# Patient Record
Sex: Male | Born: 1935 | Marital: Single | Smoking: Former smoker
Health system: Southern US, Community
[De-identification: ages and names within clinical notes are randomized; demographics above are authoritative.]

## PROBLEM LIST (undated history)

## (undated) DIAGNOSIS — I499 Cardiac arrhythmia, unspecified: Secondary | ICD-10-CM

## (undated) DIAGNOSIS — E039 Hypothyroidism, unspecified: Secondary | ICD-10-CM

## (undated) DIAGNOSIS — I10 Essential (primary) hypertension: Secondary | ICD-10-CM

## (undated) DIAGNOSIS — R2681 Unsteadiness on feet: Secondary | ICD-10-CM

## (undated) DIAGNOSIS — N19 Unspecified kidney failure: Secondary | ICD-10-CM

## (undated) DIAGNOSIS — E079 Disorder of thyroid, unspecified: Secondary | ICD-10-CM

## (undated) DIAGNOSIS — G629 Polyneuropathy, unspecified: Secondary | ICD-10-CM

## (undated) DIAGNOSIS — E785 Hyperlipidemia, unspecified: Secondary | ICD-10-CM

## (undated) DIAGNOSIS — I517 Cardiomegaly: Secondary | ICD-10-CM

## (undated) DIAGNOSIS — I350 Nonrheumatic aortic (valve) stenosis: Secondary | ICD-10-CM

## (undated) DIAGNOSIS — J449 Chronic obstructive pulmonary disease, unspecified: Secondary | ICD-10-CM

## (undated) HISTORY — DX: Disorder of thyroid, unspecified: E07.9

## (undated) HISTORY — DX: Chronic obstructive pulmonary disease, unspecified: J44.9

## (undated) HISTORY — DX: Unspecified kidney failure: N19

## (undated) HISTORY — DX: Polyneuropathy, unspecified: G62.9

## (undated) HISTORY — DX: Unsteadiness on feet: R26.81

---

## 1973-10-15 HISTORY — PX: HEMORRHOID SURGERY: SHX153

## 2008-03-22 ENCOUNTER — Ambulatory Visit: Payer: Self-pay | Admitting: Internal Medicine

## 2011-12-14 ENCOUNTER — Ambulatory Visit: Payer: Self-pay | Admitting: Internal Medicine

## 2012-02-03 LAB — COMPREHENSIVE METABOLIC PANEL
Anion Gap: 12 (ref 7–16)
BUN: 25 mg/dL — ABNORMAL HIGH (ref 7–18)
EGFR (African American): 42 — ABNORMAL LOW
EGFR (Non-African Amer.): 36 — ABNORMAL LOW
Potassium: 4.9 mmol/L (ref 3.5–5.1)
SGPT (ALT): 12 U/L
Total Protein: 7.1 g/dL (ref 6.4–8.2)

## 2012-02-03 LAB — CBC WITH DIFFERENTIAL/PLATELET
Basophil #: 0 10*3/uL (ref 0.0–0.1)
Eosinophil %: 0.1 %
HGB: 12.2 g/dL — ABNORMAL LOW (ref 13.0–18.0)
Lymphocyte #: 0.6 10*3/uL — ABNORMAL LOW (ref 1.0–3.6)
Lymphocyte %: 6.2 %
Neutrophil %: 88.1 %
Platelet: 144 10*3/uL — ABNORMAL LOW (ref 150–440)
WBC: 10.1 10*3/uL (ref 3.8–10.6)

## 2012-02-03 LAB — URINALYSIS, COMPLETE
Bacteria: NONE SEEN
Bilirubin,UR: NEGATIVE
Leukocyte Esterase: NEGATIVE
Nitrite: NEGATIVE
Ph: 5 (ref 4.5–8.0)
Protein: 30
Squamous Epithelial: <1

## 2012-02-03 LAB — CK TOTAL AND CKMB (NOT AT ARMC)
CK, Total: 424 U/L — ABNORMAL HIGH (ref 35–232)
CK-MB: 1.5 ng/mL (ref 0.5–3.6)

## 2012-02-03 LAB — TROPONIN I: Troponin-I: 0.04 ng/mL

## 2012-02-04 ENCOUNTER — Inpatient Hospital Stay: Payer: Self-pay | Admitting: Internal Medicine

## 2012-02-04 LAB — COMPREHENSIVE METABOLIC PANEL
Alkaline Phosphatase: 62 U/L (ref 50–136)
Anion Gap: 10 (ref 7–16)
BUN: 24 mg/dL — ABNORMAL HIGH (ref 7–18)
Bilirubin,Total: 0.5 mg/dL (ref 0.2–1.0)
Calcium, Total: 8.1 mg/dL — ABNORMAL LOW (ref 8.5–10.1)
Chloride: 110 mmol/L — ABNORMAL HIGH (ref 98–107)
Co2: 19 mmol/L — ABNORMAL LOW (ref 21–32)
EGFR (African American): 47 — ABNORMAL LOW
EGFR (Non-African Amer.): 41 — ABNORMAL LOW
Glucose: 163 mg/dL — ABNORMAL HIGH (ref 65–99)
Osmolality: 285 (ref 275–301)
SGOT(AST): 54 U/L — ABNORMAL HIGH (ref 15–37)
SGPT (ALT): 21 U/L
Sodium: 139 mmol/L (ref 136–145)
Total Protein: 6.4 g/dL (ref 6.4–8.2)

## 2012-02-04 LAB — CBC WITH DIFFERENTIAL/PLATELET
Basophil #: 0 10*3/uL (ref 0.0–0.1)
Eosinophil #: 0.2 10*3/uL (ref 0.0–0.7)
HCT: 35.5 % — ABNORMAL LOW (ref 40.0–52.0)
HGB: 11.7 g/dL — ABNORMAL LOW (ref 13.0–18.0)
Lymphocyte %: 18.2 %
MCH: 29.4 pg (ref 26.0–34.0)
Monocyte %: 12.2 %
Neutrophil #: 3.4 10*3/uL (ref 1.4–6.5)
Platelet: 122 10*3/uL — ABNORMAL LOW (ref 150–440)
RBC: 3.96 10*6/uL — ABNORMAL LOW (ref 4.40–5.90)
RDW: 14.4 % (ref 11.5–14.5)
WBC: 5.2 10*3/uL (ref 3.8–10.6)

## 2012-02-04 LAB — URINE CULTURE

## 2012-02-06 LAB — FOLATE: Folic Acid: 16.3 ng/mL (ref 3.1–100.0)

## 2012-02-06 LAB — TSH: Thyroid Stimulating Horm: 2.09 u[IU]/mL

## 2012-02-08 LAB — CULTURE, BLOOD (SINGLE)

## 2012-03-18 ENCOUNTER — Encounter: Payer: Self-pay | Admitting: Cardiovascular Disease

## 2012-03-18 ENCOUNTER — Ambulatory Visit (INDEPENDENT_AMBULATORY_CARE_PROVIDER_SITE_OTHER): Payer: 59 | Admitting: Cardiovascular Disease

## 2012-03-18 VITALS — BP 116/69 | HR 67 | Ht 70.0 in | Wt 190.0 lb

## 2012-03-18 DIAGNOSIS — R0602 Shortness of breath: Secondary | ICD-10-CM

## 2012-03-18 DIAGNOSIS — G20 Parkinson's disease: Secondary | ICD-10-CM | POA: Insufficient documentation

## 2012-03-18 DIAGNOSIS — E119 Type 2 diabetes mellitus without complications: Secondary | ICD-10-CM | POA: Insufficient documentation

## 2012-03-18 DIAGNOSIS — I498 Other specified cardiac arrhythmias: Secondary | ICD-10-CM | POA: Insufficient documentation

## 2012-03-18 NOTE — Patient Instructions (Signed)
Your physician wants you to follow-up in: 3 months with Dr. Nahser. You will receive a reminder letter in the mail two months in advance. If you don't receive a letter, please call our office to schedule the follow-up appointment. 

## 2012-03-18 NOTE — Assessment & Plan Note (Signed)
Mr. Micheal George presents today for a second opinion regarding his ventricular bigeminy. He was brought in by his daughter Lynden Ang. He was started on amiodarone during recent hospitalization for asymptomatic bigeminy. He has done fairly well. He's not noticed any side effects but his daughter is very concerned about the possible side effects of long-term amiodarone.  I think this bigeminy is fairly benign. He is asymptomatic. He fell because of an upper respiratory tract infection and a temperature of 100.  My advice is  that he discontinue the amiodarone. I'll see him again in 3 months for followup visit. We'll check an EKG at that time. His labs drawn a week ago are unremarkable.

## 2012-03-18 NOTE — Progress Notes (Signed)
Micheal George Date of Birth  November 15, 1924       [facility]    [place] [address], [address]  [address] [place], [place]  [address]   [place], [place]  [address] [phone]     [phone]   Fax  [phone]    Fax [phone]  Problem List: 1. History of ventricular bigeminy-he was started on amiodarone by his medical doctor 2. Hyperlipidemia 3. Diabetes mellitus 4. Hypertension 5. Hypothyroidism 6. Chronic renal insufficiency 7. Carotid artery disease - Left carotid 70-75%, right Carotid 50- 60% 8. Parkinson's disease    History of Present Illness:  Micheal George is an 78 yo seen for a second opinion who is seen today with his daughter Micheal George.  He was recently hospitalized for an episode of generalized weakness that occurred when he had an upper respiratory tract infection. He complained of a cough with yellow sputum. He had a temperature of 100. He was very weak and possibly dehydrated. He fell and was hospitalized.  During his admission he was noted to have bigeminy. He was started on amiodarone because of this bigeminy.      He was also diagnosed with Parkinsons disease and was started on Sinemet. He has not had any specific problems since he was discharged on hospital.  Current Outpatient Prescriptions on File Prior to Visit  Medication Sig Dispense Refill  . aspirin 325 MG tablet Take 325 mg by mouth daily.      . Carbidopa-Levodopa (SINEMET PO) 25/100 mg take one tablet every 6 hours      . gabapentin (NEURONTIN) 100 MG capsule Take 100 mg by mouth 2 (two) times daily.      . insulin glargine (LANTUS SOLOSTAR) 100 UNIT/ML injection Inject 36 Units into the skin every morning.      . insulin glargine (LANTUS SOLOSTAR) 100 UNIT/ML injection Inject 17 Units into the skin at bedtime.      Marland Kitchen levothyroxine (SYNTHROID, LEVOTHROID) 50 MCG tablet Take 50 mcg by mouth daily.      . Loratadine (CLARITIN) 10 MG CAPS Take 10 mg by mouth as  needed.       . metFORMIN (GLUCOPHAGE) 1000 MG tablet Take 1,000 mg by mouth 2 (two) times daily with a meal.      . olmesartan (BENICAR) 20 MG tablet Take 20 mg by mouth daily.      Marland Kitchen omeprazole (PRILOSEC) 20 MG capsule Take 20 mg by mouth daily.        No Known Allergies  Past Medical History  Diagnosis Date  . COPD (chronic obstructive pulmonary disease)   . Diabetes mellitus   . Neuropathy   . Thyroid disease     hypothyroidism  . Renal failure   . Unsteady gait     Past Surgical History  Procedure Date  . Hemorrhoid surgery     History  Smoking status  . Former Smoker  . Types: Cigarettes, Pipe  Smokeless tobacco  . Not on file    History  Alcohol Use: Not on file    Family History  Problem Relation Age of Onset  . Heart disease Father     Reviw of Systems:  Reviewed in the HPI.  All other systems are negative.  Physical Exam: Blood pressure 116/69, pulse 67, height 5\' 10"  (1.778 m), weight 190 lb (86.183 kg). General: Well developed, well nourished, in no acute distress.  Head: Normocephalic, atraumatic, sclera non-icteric, mucus membranes are moist,  Neck: Supple. Carotids are 2 + without bruits. No JVD  Lungs: Clear bilaterally to auscultation.  Heart: regular rate.  normal  S1 S2. No murmurs, gallops or rubs.  Abdomen: Soft, non-tender, non-distended with normal bowel sounds. No hepatomegaly. No rebound/guarding. No masses.  Msk:  Strength and tone are normal  Extremities: No clubbing or cyanosis. No edema.  Distal pedal pulses are 2+ and equal bilaterally.  Neuro: Alert and oriented X 3. Moves all extremities spontaneously.  Psych:  Responds to questions appropriately with a normal affect.  ECG: 03/18/2012 normal sinus rhythm at 69 beats a minute. He has frequent premature ventricular contractions. He has nonspecific ST and T wave changes.  Recent laboratory data from 03/11/2012 reveals hemoglobin of 13. His glucose is 151. Creatinine is  1.56. Potassium is 5.4. Chloride 109. CO2 is 19. Sodium is 140. His liver enzymes were normal. His total cholesterol is 114. Triglyceride levels 294. The HDL is 33. His LDL 22. His hemoglobin A1c is 6.4. TSH is 3.96.  Assessment / Plan:

## 2012-07-02 ENCOUNTER — Ambulatory Visit: Payer: 59 | Admitting: Cardiovascular Disease

## 2012-07-22 ENCOUNTER — Ambulatory Visit (INDEPENDENT_AMBULATORY_CARE_PROVIDER_SITE_OTHER): Payer: 59 | Admitting: Cardiovascular Disease

## 2012-07-22 ENCOUNTER — Encounter: Payer: Self-pay | Admitting: Cardiovascular Disease

## 2012-07-22 VITALS — BP 130/60 | HR 82 | Ht 70.0 in | Wt 188.0 lb

## 2012-07-22 DIAGNOSIS — Z79899 Other long term (current) drug therapy: Secondary | ICD-10-CM

## 2012-07-22 DIAGNOSIS — R0602 Shortness of breath: Secondary | ICD-10-CM

## 2012-07-22 DIAGNOSIS — I498 Other specified cardiac arrhythmias: Secondary | ICD-10-CM

## 2012-07-22 NOTE — Patient Instructions (Addendum)
Follow up with Dr. Elease Hashimoto in 6 months.  Need to have BMET & TSH in two weeks.

## 2012-07-22 NOTE — Progress Notes (Signed)
Micheal George Date of Birth  July 11, 1925       [facility]    [place] [address], [address]  [address] [place], [place]  [address]   [place], [place]  [address] [phone]     [phone]   Fax  [phone]    Fax [phone]  Problem List: 1. History of ventricular bigeminy-he was started on amiodarone by his medical doctor 2. Hyperlipidemia 3. Diabetes mellitus 4. Hypertension 5. Hypothyroidism 6. Chronic renal insufficiency 7. Carotid artery disease - Left carotid 70-75%, right Carotid 50- 60% 8. Parkinson's disease    History of Present Illness:  Micheal George is an 78 yo seen for a second opinion who is seen today with his daughter Micheal George.  He was recently hospitalized for an episode of generalized weakness that occurred when he had an upper respiratory tract infection. He complained of a cough with yellow sputum. He had a temperature of 100. He was very weak and possibly dehydrated. He fell and was hospitalized.  During his admission he was noted to have bigeminy. He was started on amiodarone because of this bigeminy.      He was also diagnosed with Parkinsons disease and was started on Sinemet.  He thinks that he is having more dizziness since the hospitalization.  I saw him 4 months ago and stopped the amiodarone.  He has not had any cardiac  problems since that time.  He is still unsteady on his feet - symptoms likely related to his  Parkinsons or Parkinsons medications.   Current Outpatient Prescriptions on File Prior to Visit  Medication Sig Dispense Refill  . acetaminophen (TYLENOL) 325 MG tablet Take 650 mg by mouth every 6 (six) hours as needed.      Marland Kitchen aspirin 325 MG tablet Take 325 mg by mouth daily.      . Carbidopa-Levodopa (SINEMET PO) 25/100 mg take one tablet every 6 hours      . Fluticasone-Salmeterol (ADVAIR) 100-50 MCG/DOSE AEPB Inhale 1 puff into the lungs daily as needed.      . gabapentin (NEURONTIN) 100 MG  capsule Take 100 mg by mouth 2 (two) times daily.      . GuaiFENesin (MUCUS RELIEF ADULT PO) Take by mouth as needed.      . insulin glargine (LANTUS SOLOSTAR) 100 UNIT/ML injection Inject 36 Units into the skin every morning.      . insulin glargine (LANTUS SOLOSTAR) 100 UNIT/ML injection Inject 17 Units into the skin at bedtime.      . Loratadine (CLARITIN) 10 MG CAPS Take 10 mg by mouth as needed.       . metFORMIN (GLUCOPHAGE) 1000 MG tablet Take 1,000 mg by mouth 2 (two) times daily with a meal.      . olmesartan (BENICAR) 20 MG tablet Take 20 mg by mouth daily.      Marland Kitchen omeprazole (PRILOSEC) 20 MG capsule Take 20 mg by mouth daily.      . simvastatin (ZOCOR) 20 MG tablet Take 20 mg by mouth daily.        No Known Allergies  Past Medical History  Diagnosis Date  . COPD (chronic obstructive pulmonary disease)   . Diabetes mellitus   . Neuropathy   . Thyroid disease     hypothyroidism  . Renal failure   . Unsteady gait     Past Surgical History  Procedure Date  . Hemorrhoid surgery     History  Smoking status  .  Former Smoker  . Types: Cigarettes, Pipe  Smokeless tobacco  . Not on file    History  Alcohol Use No    Family History  Problem Relation Age of Onset  . Heart disease Father     Reviw of Systems:  Reviewed in the HPI.  All other systems are negative.  Physical Exam: Blood pressure 130/60, pulse 82, height 5\' 10"  (1.778 m), weight 188 lb (85.276 kg). General: Well developed, well nourished, in no acute distress.  He has a flat affect. He walks with a shuffled gait.  Head: Normocephalic, atraumatic, sclera non-icteric, mucus membranes are moist,   Neck: Supple. Carotids are 2 + without bruits. No JVD  Lungs: Clear bilaterally to auscultation.  Heart: regular rate.  normal  S1 S2. No murmurs, gallops or rubs.  Abdomen: Soft, non-tender, non-distended with normal bowel sounds. No hepatomegaly. No rebound/guarding. No masses.  Msk:  His strength is  decreased.  Extremities: No clubbing or cyanosis. No edema.  Distal pedal pulses are 2+ and equal bilaterally.  Neuro: Alert and oriented X 3. Moves all extremities spontaneously.  Psych:  Responds to questions appropriately with a normal affect.  ECG: 03/18/2012 normal sinus rhythm at 69 beats a minute. He has frequent premature ventricular contractions. He has nonspecific ST and T wave changes.   laboratory data from 03/11/2012 reveals hemoglobin of 13. His glucose is 151. Creatinine is 1.56. Potassium is 5.4. Chloride 109. CO2 is 19. Sodium is 140. His liver enzymes were normal. His total cholesterol is 114. Triglyceride levels 294. The HDL is 33. His LDL 22. His hemoglobin A1c is 6.4. TSH is 3.96.  Assessment / Plan:

## 2012-07-22 NOTE — Assessment & Plan Note (Signed)
He continues to have problems with ventricular bigeminy. For the most part I think he's asymptomatic.  He's having lots of symptoms due to his carbidopa levodopa therapy and I suspect that this is wise having the dizziness.  He's also having some problems with hypothyroidism. He's had his Synthroid dose held for reasons that are somewhat unclear.  Have recommended that he take all of his medications as prescribed. I'll see him back in 6 months.

## 2012-08-05 ENCOUNTER — Ambulatory Visit (INDEPENDENT_AMBULATORY_CARE_PROVIDER_SITE_OTHER): Payer: 59

## 2012-08-05 DIAGNOSIS — R0602 Shortness of breath: Secondary | ICD-10-CM

## 2012-08-05 DIAGNOSIS — Z79899 Other long term (current) drug therapy: Secondary | ICD-10-CM

## 2012-08-05 DIAGNOSIS — I498 Other specified cardiac arrhythmias: Secondary | ICD-10-CM

## 2012-08-06 LAB — BASIC METABOLIC PANEL
BUN: 29 mg/dL — ABNORMAL HIGH (ref 8–27)
Chloride: 105 mmol/L (ref 97–108)
Creatinine, Ser: 1.84 mg/dL — ABNORMAL HIGH (ref 0.76–1.27)
GFR calc Af Amer: 38 mL/min/{1.73_m2} — ABNORMAL LOW (ref >59)
Glucose: 167 mg/dL — ABNORMAL HIGH (ref 65–99)

## 2012-08-06 LAB — TSH: TSH: 1.58 u[IU]/mL (ref 0.450–4.500)

## 2012-08-07 ENCOUNTER — Other Ambulatory Visit: Payer: Self-pay

## 2012-10-13 ENCOUNTER — Ambulatory Visit: Payer: Self-pay | Admitting: Internal Medicine

## 2012-10-24 ENCOUNTER — Ambulatory Visit: Payer: Self-pay | Admitting: Internal Medicine

## 2013-03-19 ENCOUNTER — Ambulatory Visit: Payer: Self-pay | Admitting: Ophthalmology

## 2013-03-19 LAB — POTASSIUM: Potassium: 4.2 mmol/L (ref 3.5–5.1)

## 2013-04-06 ENCOUNTER — Ambulatory Visit: Payer: Self-pay | Admitting: Ophthalmology

## 2014-06-02 ENCOUNTER — Emergency Department: Payer: Self-pay | Admitting: Internal Medicine

## 2014-06-02 LAB — URINALYSIS, COMPLETE
BILIRUBIN, UR: NEGATIVE
BLOOD: NEGATIVE
Bacteria: NONE SEEN
GLUCOSE, UR: NEGATIVE mg/dL (ref 0–75)
Hyaline Cast: 2
KETONE: NEGATIVE
Leukocyte Esterase: NEGATIVE
Nitrite: NEGATIVE
Ph: 5 (ref 4.5–8.0)
RBC,UR: NONE SEEN /HPF (ref 0–5)
Specific Gravity: 1.011 (ref 1.003–1.030)
Squamous Epithelial: NONE SEEN
WBC UR: <1 /HPF (ref 0–5)

## 2014-06-02 LAB — COMPREHENSIVE METABOLIC PANEL
ALK PHOS: 73 U/L
AST: 24 U/L (ref 15–37)
Albumin: 3.5 g/dL (ref 3.4–5.0)
Anion Gap: 10 (ref 7–16)
BUN: 27 mg/dL — ABNORMAL HIGH (ref 7–18)
Bilirubin,Total: 0.4 mg/dL (ref 0.2–1.0)
CALCIUM: 8.6 mg/dL (ref 8.5–10.1)
CHLORIDE: 110 mmol/L — AB (ref 98–107)
Co2: 23 mmol/L (ref 21–32)
Creatinine: 1.86 mg/dL — ABNORMAL HIGH (ref 0.60–1.30)
EGFR (Non-African Amer.): 32 — ABNORMAL LOW
GFR CALC AF AMER: 37 — AB
GLUCOSE: 228 mg/dL — AB (ref 65–99)
Osmolality: 297 (ref 275–301)
Potassium: 4.6 mmol/L (ref 3.5–5.1)
SGPT (ALT): 19 U/L
SODIUM: 143 mmol/L (ref 136–145)
Total Protein: 6.5 g/dL (ref 6.4–8.2)

## 2014-06-02 LAB — CBC
HCT: 42.6 % (ref 40.0–52.0)
HGB: 13.8 g/dL (ref 13.0–18.0)
MCH: 30.5 pg (ref 26.0–34.0)
MCHC: 32.4 g/dL (ref 32.0–36.0)
MCV: 94 fL (ref 80–100)
Platelet: 124 10*3/uL — ABNORMAL LOW (ref 150–440)
RBC: 4.53 10*6/uL (ref 4.40–5.90)
RDW: 14.8 % — ABNORMAL HIGH (ref 11.5–14.5)
WBC: 8 10*3/uL (ref 3.8–10.6)

## 2014-06-02 LAB — PROTIME-INR
INR: 1.2
PROTHROMBIN TIME: 15.2 s — AB (ref 11.5–14.7)

## 2014-06-02 LAB — TROPONIN I: TROPONIN-I: 0.04 ng/mL

## 2014-06-02 LAB — URIC ACID: Uric Acid: 8.1 mg/dL — ABNORMAL HIGH (ref 3.5–7.2)

## 2014-09-13 ENCOUNTER — Ambulatory Visit: Payer: Self-pay | Admitting: Internal Medicine

## 2014-12-23 ENCOUNTER — Emergency Department: Payer: Self-pay | Admitting: Emergency Medicine

## 2015-02-04 NOTE — Op Note (Signed)
PATIENT NAME:  Micheal George, Micheal George MR#:  [id number] DATE OF BIRTH:  June 09, 1925  DATE OF PROCEDURE:  04/06/2013  PREOPERATIVE DIAGNOSIS:  Cataract, left eye.    POSTOPERATIVE DIAGNOSIS:  Cataract, left eye.  PROCEDURE PERFORMED:  Extracapsular cataract extraction using phacoemulsification with placement of an Alcon [id number], 22.5-diopter posterior chamber lens, serial #[id number].  SURGEON:  Maylon PeppersSteven A. Tan Clopper, MD  ASSISTANT:  None.  ANESTHESIA:  4% lidocaine and 0.75% Marcaine in a 50/50 mixture with 10 units/mL of Hylenex added, given as a peribulbar.   ANESTHESIOLOGIST:  Dr. Maisie Fushomas.  COMPLICATIONS:  None.  ESTIMATED BLOOD LOSS:  Less than 1 ml.  DESCRIPTION OF PROCEDURE:  The patient was brought to the operating room and given a peribulbar block.  The patient was then prepped and draped in the usual fashion.  The vertical rectus muscles were imbricated using 5-0 silk sutures.  These sutures were then clamped to the sterile drapes as bridle sutures.  A limbal peritomy was performed extending two clock hours and hemostasis was obtained with cautery.  A partial thickness scleral groove was made at the surgical limbus and dissected anteriorly in a lamellar dissection using an Alcon crescent knife.  The anterior chamber was entered supero-temporally with a Superblade and through the lamellar dissection with a 2.6 mm keratome.  DisCoVisc was used to replace the aqueous and a continuous tear capsulorrhexis was carried out.  Hydrodissection and hydrodelineation were carried out with balanced salt and a 27 gauge canula.  The nucleus was rotated to confirm the effectiveness of the hydrodissection.  Phacoemulsification was carried out using a divide-and-conquer technique.  Total ultrasound time was 2 minutes and 22.3 seconds with an average power of 23.2 percent and CDE of 58.27.  Irrigation/aspiration was used to remove the residual cortex.  DisCoVisc was used to inflate the capsule and the  internal incision was enlarged to 3 mm with the crescent knife.  The intraocular lens was folded and inserted into the capsular bag using the AcrySert delivery system.  Irrigation/aspiration was used to remove the residual DisCoVisc.  Miostat was injected into the anterior chamber through the paracentesis track to inflate the anterior chamber and induce miosis. A tenth of a milliliter of cefuroxime was placed into the anterior chamber via the paracentesis tract. The wound was checked for leaks and none were found. The conjunctiva was closed with cautery and the bridle sutures were removed.  Two drops of 0.3% Vigamox were placed on the eye.   An eye shield was placed on the eye.  The patient was discharged to the recovery room in good condition.  ____________________________ Maylon PeppersSteven A. Zhane Donlan, MD sad:sb D: 04/06/2013 14:16:50 ET T: 04/06/2013 16:18:01 ET JOB#: [id number]  cc: Viviann SpareSteven A. Ancelmo Hunt, MD, <Dictator> Erline LevineSTEVEN A Dream Harman MD ELECTRONICALLY SIGNED 04/08/2013 11:36

## 2015-02-06 NOTE — Consult Note (Signed)
PATIENT NAME:  Micheal George, Leavy H MR#:  [id number] DATE OF BIRTH:  Mar 15, 1925  DATE OF CONSULTATION:  02/07/2012  REFERRING PHYSICIAN:  Dr. Juel BurrowMasoud  CONSULTING PHYSICIAN:  Annice NeedyJason S. Dew, MD  REASON FOR CONSULTATION: Carotid artery stenosis.   HISTORY OF PRESENT ILLNESS: The patient was admitted four days prior for gait disturbances and falls. He basically felt that his legs gave out on him and he could not carry himself and so his grandson picked him up off the floor. He just felt like both legs were too weak to hold him. He has been having cough and had low-grade fever at the time of admission. He had a possible urinary tract infection for which he was prescribed Cipro a week or so ago but this had not yet been started. Part of his work-up while here included a carotid ultrasound. This is somewhat difficult to ferret out the reading but it is suggested that the left carotid may be a high-grade stenosis and the right carotid may be a moderate stenosis with anterograde flow in both vertebral arteries. He is actually feeling well now and is being discharged home later today. CT angiogram could not be performed due to baseline chronic kidney disease.   PAST MEDICAL HISTORY:  1. Chronic obstructive pulmonary disease.  2. Diabetes.  3. Neuropathy.   PAST SURGICAL HISTORY: Hemorrhoid surgery.   ALLERGIES: None known.   MEDICATIONS PER THE HOSPITAL LIST:  1. Synthroid 50 mcg daily.  2. Sinemet 25/50, 1 tab q.i.d.  3. Simvastatin 40 mg daily.  4. Omeprazole 20 mg daily.  5. Mucus relief as needed.  6. Metformin 1000 mg b.i.d.  7. Meclizine 25 mg b.i.d.  8. Lantus 36 units once daily in the morning and 17 units once daily at night. 9. Gabapentin 100 mg b.i.d. 10. Claritin 10 mg daily. 11. Benicar 20 mg daily.  12. Aspirin 325 mg daily.  13. Amiodarone 400 mg daily. 14. Advair Diskus 250/50 daily   SOCIAL HISTORY: Married, accompanied by wife. Quit smoking many years ago. No alcohol or illicit  drug use.   FAMILY HISTORY: Noncontributory.   REVIEW OF SYSTEMS: GENERAL: Positive for fatigue and weakness particularly on admission which has improved. No fevers or chills, although he did have a temperature of 100 at one point earlier on admission. EYES: No blurred or double vision. EARS: No tinnitus or ear pain. He does have chronic hearing loss. RESPIRATORY: Has cough productive of yellow sputum. CARDIOVASCULAR: No chest pain or palpitations. GASTROINTESTINAL: No nausea or vomiting. No abdominal pain. GENITOURINARY: No dysuria, hematuria. ENDOCRINE: No heat or cold intolerance. PSYCH: No substance abuse or depression. NEUROLOGIC: As per history of present illness. SKIN: No new rashes or ulcers.   PHYSICAL EXAMINATION:  GENERAL: This is an elderly white male who is sitting in a wheelchair and actually looks to be getting prepared for discharge not in apparent distress.   VITAL SIGNS: Temperature 97.3, pulse 58, blood pressure 129/56, saturations 91% on room air.   HEENT: Normocephalic, atraumatic. Eyes: Sclerae nonicteric. Conjunctivae are clear. Ears: Normal external appearance. Hearing is decreased.   HEART: Regular rate and rhythm without murmur.   LUNGS: Has some expiratory wheezes and somewhat diminished bilaterally. Carotids have good upstroke with bilateral bruits.   ABDOMEN: Soft, nondistended, nontender.   EXTREMITIES: He has mild lower extremity edema which is chronic with some chronic stasis changes. His feet are warm with good capillary refill.   NEUROLOGIC: He does not have focal weakness or numbness in  any extremity. He does appear to have generalized weakness.   SKIN: Warm and dry.   LABORATORY, DIAGNOSTIC AND RADIOLOGICAL DATA: No lab work for the past couple of days. His creatinine clearance was 41 on an earlier basal metabolic panel. Ultrasound findings as described above.   ASSESSMENT AND PLAN: This is an 81 year old white male with multiple ongoing issues who is  admitted with weakness and falls, seems to have improved. It is not clear to me if this were related to some sort of urinary tract infection or other issue. He did appear to have some significant degree of carotid stenosis on his ultrasound. This could not be evaluated further with CT angiogram due to renal insufficiency. At this time he is being discharged and I have offered him follow up in the office to discuss ongoing evaluation. We will plan on checking another ultrasound in the next couple of months for continuing evaluation and if he is felt to have a high-grade lesion on follow-up ultrasound we would discuss options for treatment which he would be a reasonably high risk candidate for any treatment options. We discussed signs and symptoms of stroke. He does not appear to have any focal deficits now that are consistent with stroke. We will be happy to see him sooner than scheduled visit if he develops any focal neurologic symptoms.   This is a level-4 consultation.  ____________________________ Annice Needy, MD jsd:cms D: 02/10/2012 15:04:54 ET T: 02/11/2012 09:50:04 ET JOB#: [id number]  cc: Annice Needy, MD, <Dictator> Annice Needy MD ELECTRONICALLY SIGNED 03/04/2012 12:06

## 2015-02-06 NOTE — Consult Note (Signed)
PATIENT NAME:  Micheal George, Micheal George MR#:  [id number] DATE OF BIRTH:  1925-08-22  DATE OF CONSULTATION:  02/04/2012  REFERRING PHYSICIAN:  Corky DownsJaved Masoud, MD  CONSULTING PHYSICIAN:  Venita Seng B. Tariq Pernell, MD  REASON FOR CONSULTATION: To evaluate patient for sundowning.   IDENTIFYING DATA: Mr. Micheal George is an 81 year old male with no psychiatric history.   CHIEF COMPLAINT: "I was dizzy".   HISTORY OF PRESENT ILLNESS: Mr. Micheal George was admitted to the medical floor after a fall. Per the patient's report, he fell several times lately. He feels dizzy and it is hard for him to maintain balance. He has to get up several times at night to go to the bathroom. He was reportedly found to have arrhythmia with bradycardia. That could possibly explain some of his symptoms. The patient is unaware of evening time confusion. When talking to his daughter, I realize that the patient does have episodes of sundowning where he is slightly agitated and forgetful. He sometimes does not recognize his family. Reportedly such an episode happened in the hospital on the 21st but the only detail the daughter was able to provide was that the patient was unable to wait for the staff member to help him to the bathroom. I am not certain if this really represents sundowning. The patient is unaware of any symptoms or confusion. To the contrary, he seems to be a very good historian, a very pleasant man quite intelligent and educated. The daughter reports that there has been some cognitive decline. The patient has been getting lost while driving. He denies. Some of the information that the daughter has comes from his wife. They have, and have always had, a very difficult relationship. The wife has mental illness. She has been hospitalized after a suicide attempt. She has been selfish and unwilling to get treatment. The patient reports that on numerous occasions they tried to seek counseling but the wife was unwilling to go through it. The patient  himself denies any history of mental illness. He is unaware of cognitive decline either. He denies hallucinations but his daughter reports that he hallucinates at times.   PAST PSYCHIATRIC HISTORY: As above.   FAMILY PSYCHIATRIC HISTORY: None reported.   PAST MEDICAL HISTORY:  1. Chronic obstructive pulmonary disease.  2. Diabetes. 3. Diabetic neuropathy.  4. History of falls.   ALLERGIES: No known drug allergies.   MEDICATIONS ON ADMISSION:  1. Simvastatin 40 mg daily.  2. Omeprazole 20 mg daily.  3. Metformin 1000 mg twice daily.  4. Meclizine 25 mg twice daily.  5. Levoxyl 50 mcg daily.   6. Lantus SoloSTAR Pen 36 units in the morning, 17 units at night.  7. Glipizide 10 mg daily.  8. Neurontin 100 mg twice daily.  9. Claritin 10 mg daily.  10. Benicar 20 mg daily.  11. Aspirin 325 mg daily.  12. Advair Diskus 250 mcg daily.   MEDICATIONS AT THE TIME OF CONSULTATION:  1. Rocephin injection 1 gram. 2. Advair Diskus 100/50 inhaler twice daily.  3. Sliding scale insulin. 4. Synthroid 0.05 mg daily.  5. Zofran injection as needed.  6. Protonix 40 mg in the morning. 7. Zocor 40 mg at bedtime. 8. Aspirin 81 mg. 9. Azithromycin injections. 10. Insulin Lantus 36 in the morning, 17 units in the evening. 11. Metformin 500 mg twice daily.  12. Benicar 20 mg daily.   SOCIAL HISTORY: The patient has a college education. He has worked all his life, was able to provide not only for his  wife and his three daughters but also multiple grandchildren and great-grandchildren, put everybody through college. He has enough money to pay for assisted living facility if he and his wife need it. His preference is to stay home. His wife broke her leg and has been homebound. The patient has been taking care of her. He drives to do shopping but also bring food from restaurants to home. They do not cook. The three daughters live somewhat away, one in [place], one in [place]. I spoke with the daughter  who lives in [place]. She is convinced that there is a serious cognitive decline and sundowning which I honestly don't see. She does not have healthcare power-of-attorney and feels that it will be very difficult to arrange for a safe environment for her parents. She considers hiring someone to assist her parents 24/7. Discussions about placement in a structured environment are difficult with the patient and his wife. She hopes that the patient will be discharged to a skilled nursing facility for rehabilitation following this hospitalization and it will give her time to start making arrangements. We recommended she obtain healthcare power-of-attorney.    REVIEW OF SYSTEMS: CONSTITUTIONAL: No fevers or chills. Some fatigue. No weight changes. EYES: No double or blurred vision. ENT: No hearing loss. RESPIRATORY: No shortness of breath. CARDIOVASCULAR: No chest pain or orthopnea. GASTROINTESTINAL: No abdominal pain, nausea, vomiting, or diarrhea. GU: No incontinence or frequency. ENDOCRINE: No heat or cold intolerance. LYMPHATIC: No anemia or easy bruising. INTEGUMENTARY: No acne or rash. MUSCULOSKELETAL: Positive for deconditioning and falls. NEUROLOGIC: The patient is awaiting consultation. PSYCHIATRIC: See history of present illness for details.   PHYSICAL EXAMINATION:   VITAL SIGNS: Blood pressure 137/51, pulse 43, respirations 18, temperature 97.4.   GENERAL: This is a well developed male in no acute distress. The rest of the physical examination is deferred to the primary provider.   LABORATORY DATA: Chemistries glucose 164, BUN 24, creatinine 1.55, sodium 139, potassium 4.3. LFTs are within normal limits except for AST of 54. CBC mild anemia. White blood count 5.2, RBC 3.96, hemoglobin 11.7, hematocrit 35.5, platelets 122. Urinalysis is not suggestive of urinary tract infection.   EKG normal sinus rhythm with heart rate of 88, nonspecific ST and T wave abnormality,   MENTAL STATUS EXAMINATION: The  patient is alert and oriented to person, place, time, and situation. He is pleasant, polite, cooperative, and interactive. He is in bed wearing a hospital gown. He maintains very good eye contact. His speech is soft. Mood is fine with slightly tearful affect especially when telling me stories about his wife sick with mental illness unwilling to take treatment and his children, grandchildren, and great-grandchildren who are all doing splendidly. Thought processing is logical and goal oriented. Thought content he denies suicidal or homicidal ideation. There are no delusions or paranoia. There are no auditory or visual hallucinations. His cognition is intact. He registers 3 out of 3 and recalls 3 out of 3 objects after five minutes. He can spell world and do serial sevens. He knows the current president. His insight and judgment are good.   SUICIDE RISK ASSESSMENT: This is a patient with no past psychiatric history, in my opinion very little cognitive decline, who has been able to cope very well with life adversities and is overwhelmed with physical rather than mental illness.   DIAGNOSES:  AXIS I:  1. Cognitive disorder, not otherwise specified per family's report.  2. Rule out delirium secondary to medication or medical condition.  AXIS II: Deferred.   AXIS III:  1. Diabetes.  2. Diabetic neuropathy.   AXIS IV: Chronic physical illness, marital problems.   AXIS V: GAF 45.   PLAN: I do not see much in terms of cognitive decline in this patient. If the daughter is correct that there are some symptoms of cognitive decline it probably is within normal for his age. She reports agitation, irritation, and hallucinations. They could have been related to possible delirium from unknown medical cause or medication. I will start low dose Haldol 0.5 mg twice daily. He reportedly was agitated last night. We may try low dose Haldol 2 mg for agitation. We discussed that the use of new antipsychotic is associated  with increased risk of cardiovascular and cerebrovascular deaths. The family needs to keep it in mind. I will follow-up.   ____________________________ Ellin Goodie. Jennet Maduro, MD jbp:drc D: 02/05/2012 16:21:00 ET T: 02/05/2012 17:03:40 ET JOB#: [id number]  cc: Lebert Lovern B. Jennet Maduro, MD, <Dictator> Shari Prows MD ELECTRONICALLY SIGNED 02/10/2012 18:48

## 2015-02-06 NOTE — Consult Note (Signed)
Patient admitted with gait disturbances, near syncope.  Work up included a carotid US which suggested left ICA stenosis that may be in the high grade range and moderate right ICA stenosis.  He was not felt to have a stroke or a symptomatic lesion by neurology.  CTA not able to be done due to creatinine clearance.  He feels well now and is being discharged today.  I will see him in the office in 3-4 weeks with a repeat duplex for further evaluation.  Discussed signs and symptoms of stroke, and pathophysiology and usual treatment options for carotid disease.    Electronic Signatures: Annice Needyew, Letonya Mangels S (MD)  (Signed on 25-Apr-13 16:09)  Authored  Last Updated: 25-Apr-13 16:09 by Annice Needyew, Coston Mandato S (MD)

## 2015-02-06 NOTE — Consult Note (Signed)
Brief Consult Note: Diagnosis: Cognitive disorder NOS.   Patient was seen by consultant.   Consult note dictated.   Recommend further assessment or treatment.   Orders entered.   Comments: I was asked to assess the patient with h/o cognitive decline, falls and sundowning. The patient is not aware of sundowning. During the interview, the patient very plaesant, engaging and a very good historian. Per daughter, he can be impatient and forgetful. Last night, he was unable to wait for staff to help him to the bathroom.   PLAN: 1. Will start low dose Haldol in the evening.  2. The patient will be likely discharged to SNF for rehab. The family needs to find permanent solution for the patient and his wife as they are unsafe at home. There is no HCPOA and the patient refuses placement.  3. I will follow up.  Electronic Signatures: Kristine LineaPucilowska, Sherilyn Windhorst (MD)  (Signed 22-Apr-13 18:41)  Authored: Brief Consult Note   Last Updated: 22-Apr-13 18:41 by Kristine LineaPucilowska, Jahdiel Krol (MD)

## 2015-02-06 NOTE — Consult Note (Signed)
Referring Physician:  Cletis Athens :   Primary Care Physician:  Cletis Athens : [address] [place], [place], [place] [address], [place] [phone]  Reason for Consult:  Admit Date: 03-Feb-2012   Chief Complaint: gait problems   Reason for Consult: CVA   History of Present Illness:  History of Present Illness:   81 yo RHD M presents to [facility] after having gait problems.  Pt reports that he has been having gait problems for the last 20 years after he retired that have progressively worsened to the point that he quit golfing about 10 years ago due to continued weakness.  Pt reports problems getting up and out of the bed and notes that he has fallen a few times backward.  This weakness does not fluctuate and is constantly progressive.  He denies any pain in his upper legs but does state that he has some pain in the lower legs from his neuropathy.  Pt also reports hurting his R hip after a fall backwards about a month ago and that he has pain on that side.  Pt notes having tremors in his R>L hand as well.  There are no memory problems reported.  Pt takes care of his own right ADL's daily and for his wife.  Neither cooks dinner but he drives to local restaurants to get their food.  He pays all of the bills and never reports getting lost.  ROS:   General weakness    HEENT no complaints    Lungs no complaints    Cardiac no complaints    GI no complaints    GU no complaints    Musculoskeletal R hip pain    Extremities pain    Skin no complaints    Neuro occasional dizziness when his sugars are low    Endocrine no complaints    Psych no complaints   Past Medical/Surgical Hx:  Hypothyroidism:   Hypercholesterolemia:   GERD - Esophageal Reflux:   neuropathy:   Diabetes:   COPD:   Hemorrhoidectomy:   Appendectomy:   Past Medical/ Surgical Hx:   Past Medical History as above    Past Surgical History as above   Home Medications: Medication Instructions Last Modified  Date/Time  glipiZIDE 10 mg oral tablet 1 tab(s) orally once a day 22-Apr-13 14:51  metformin 1000 mg oral tablet 1 tab(s) orally 2 times a day 22-Apr-13 14:51  Levoxyl 50 mcg (0.05 mg) oral tablet 1 tab(s) orally once a day 22-Apr-13 14:51  gabapentin 100 mg oral capsule 1 cap(s) orally 2 times a day 22-Apr-13 14:51  Benicar 20 mg oral tablet 1 tab(s) orally once a day 22-Apr-13 14:51  omeprazole 20 mg oral delayed release capsule 1 cap(s) orally once a day 22-Apr-13 14:51  Advair Diskus 250 mcg-50 mcg inhalation powder 1 puff(s) inhaled once a day 22-Apr-13 14:51  simvastatin 40 mg oral tablet 1 tab(s) orally once a day 22-Apr-13 14:51  aspirin 325 mg oral tablet 1 tab(s) orally once a day 22-Apr-13 14:51  MucusRelief DM 20 mg-400 mg oral tablet  orally once a day, As Needed 22-Apr-13 14:51  meclizine 25 mg oral tablet 1 tab(s) orally 2 times a day, As Needed 22-Apr-13 14:51  Lantus Solostar Pen 100 units/mL subcutaneous solution 36 unit(s) subcutaneous once a day AT MORNING 22-Apr-13 14:51  Lantus Solostar Pen 100 units/mL subcutaneous solution 17 unit(s) subcutaneous once a day (at bedtime) 22-Apr-13 14:51  Claritin 10 mg oral tablet 1 tab(s) orally once a day, As  Needed 22-Apr-13 14:51   [facility] Current Meds:  cefTRIAXone injection, ( Rocephin injection )  1 gram, IV Piggyback, q24h, Infuse over 30 minute(s)  Indication: Infection, -Then pharmacy to dose  Insulin SS -Novolog injection, Subcutaneous, FSBS before meals and at bedtime  0 units if FSBS 0-150     2 unit(s) if FSBS 151 - 200     4 unit(s) if FSBS 201 - 250     6 unit(s) if FSBS 251 - 300     8 unit(s) if FSBS 301 - 350     10 unit(s) if FSBS 351 - 400  Call MD if FSBS is greater than 400, [Waste Code: Black]  Ondansetron injection, ( Zofran injection )  4 mg, IV push, q4h PRN for Nausea/Vomiting  Indication: Nausea/ Vomiting  Fluticasone/Salmeterol 100/50 inhaler,  ( Advair Diskus 100/50 inhaler )  1 puff(s) Inhalation  bid  Instructions:  RINSE MOUTH AFTER USE  Levothyroxine tablet, ( Synthroid)  0.05 mg Oral q6am  - Indication: Thyroid Hormone Replacement  Pantoprazole tablet,  ( Protonix)  40 mg Oral q6am  - Indication: Erosive Esophagitis/ GERD  Instructions:  DO NOT CRUSH  Simvastatin tablet, ( Zocor)  40 mg Oral at bedtime  - Indication: Hypercholesterolemia  Azithromycin injection, 250 mg in Dextrose 5% 250 ml, IV Piggyback, q24h, Infuse over 60 minute(s)  Indication: Infection  metFORMIN tablet, ( Glucophage)  500 mg Oral bid/wm  - Indication: Diabetes  Instructions:  Give with meals  Insulin Glargine (Human) injection,  ( Lantus Insulin injection )  36 unit(s), Subcutaneous, <User Schedule> ( every 1 day: 09:00 )  Indication: Diabetes, [Waste Code: Black]  Aspirin Enteric Coated tablet, ( Ecotrin)  81 mg Oral daily  - Indication: Pain/Fever/Thromboembolic Disorders/Post MI/Prophylaxis MI  Instructions:  Initiate Bleeding Precautions Protocol--DO NOT CRUSH  Insulin Glargine (Human) injection,  ( Lantus Insulin injection )  17 unit(s), Subcutaneous, at bedtime  Indication: Diabetes, [Waste Code: Black]  Olmesartan tablet,  ( Benicar)  20 mg Oral daily  - Indication: Hypertension  QUEtiapine tablet,  ( SeroQuel)  12.5 mg Oral bid PRN for agitation  - Indication: Management of signs/symptoms of psychotic disorders/Anxiety  Carbidopa/Levodopa 25/100 tablet,  ( Sinemet 25/100)  1 tablet(s) Oral tid/wm  - Indication: Parkinson's/Cerebral Arteriosclerosis/Restless Leg Syndrome  Gabapentin capsule, ( Neurontin)  300 mg Oral bid  - Indication: Seizures/ Mood Stabilizer  Allergies:  No Known Allergies:   Social/Family History:  Employment Status: retired   Lives With: significant other; spouse   Living Arrangements: house   Social History: no tob, no EtOH, no illicits   Family History: n/c   Vital Signs: **Vital Signs.:   23-Apr-13 12:03   Vital Signs Type Tele   Pulse Pulse 81    Pulse source per Telemetry Clerk   Telemetry pattern Cardiac Rhythm Normal sinus rhythm; PVC's; bigemony every other beat   Physical Exam:  General: elderly man who looks younger than stated age, no acute distress, well groomed   HEENT: normocephalic, sclera nonicteric, oropharynx clear, mucous membranes moist   Neck: supple, no JVD, no bruits   Chest: CTA B, no wheezes, good movement   Cardiac: RRR , no murmurs, 2+ pulses   Extremities: no C/C/E, full ROM   Neurologic Exam:  Mental Status: A+Ox4, nl speech and language,  MMSE 26/28,  excellent concrete thinking   Cranial Nerves: PERRLA, EOMI except for hypometric saccades, nl VF, face symmetric with mild hypomimia, tongue midline, shoulder shrug equal  Motor Exam: 5/5 B without any true weakness, slight increased tone in R arm and leg,  mild resting tremor noted in a pill rolling fashion in R UE, B minimal bradykinesia   Deep Tendon Reflexes: absent B LE, 1+ B UE, mute plantars   Sensory Exam: decreased pinprick, vibration and temp in stocking glove patter   Coordination: FTN and HTS nl   Cerebellar Exam: slightly bradykinetic   Gait: antalgic because of R hip yet stupped with [place] turning, decreased arm swing   Lab Results: Hepatic:  22-Apr-13 04:59    Bilirubin, Total 0.5   Alkaline Phosphatase 62   SGPT (ALT) 21   SGOT (AST)   54   Total Protein, Serum 6.4   Albumin, Serum   3.1  Routine Micro:  21-Apr-13 05:27    Specimen Source CC  Routine Chem:  21-Apr-13 02:45    B-Type Natriuretic Peptide [facility])   6421  22-Apr-13 04:59    Glucose, Serum   163   BUN   24   Creatinine (comp)   1.55   Sodium, Serum 139   Potassium, Serum 4.3   Chloride, Serum   110   CO2, Serum   19   Calcium (Total), Serum   8.1   Osmolality (calc) 285   eGFR (African American)   47   eGFR (Non-African American)   41   Anion Gap 10  Cardiac:  21-Apr-13 02:45    Troponin I 0.04   CK, Total   424   CPK-MB, Serum 1.5  Routine UA:   21-Apr-13 05:27    Color (UA) Yellow   Clarity (UA) Clear   Glucose (UA) Negative   Bilirubin (UA) Negative   Ketones (UA) Trace   Specific Gravity (UA) 1.016   Blood (UA) 2+   pH (UA) 5.0   Protein (UA) 30 mg/dL   Nitrite (UA) Negative   Leukocyte Esterase (UA) Negative   RBC (UA) 4 /HPF   WBC (UA) <1 /HPF   Epithelial Cells (UA) <1 /HPF   Mucous (UA) PRESENT  Routine Hem:  22-Apr-13 04:59    WBC (CBC) 5.2   RBC (CBC)   3.96   Hemoglobin (CBC)   11.7   Hematocrit (CBC)   35.5   Platelet Count (CBC)   122   MCV 90   MCH 29.4   MCHC 32.9   RDW 14.4   Neutrophil % 65.7   Lymphocyte % 18.2   Monocyte % 12.2   Eosinophil % 3.3   Basophil % 0.6   Neutrophil # 3.4   Lymphocyte #   0.9   Monocyte # 0.6   Eosinophil # 0.2   Basophil # 0.0   Radiology Results: CT:    21-Apr-13 02:58, CT Head Without Contrast   CT Head Without Contrast    REASON FOR EXAM:    falling  COMMENTS:       PROCEDURE: CT  - CT HEAD WITHOUT CONTRAST  - Feb 03 2012  2:58AM     RESULT:     Technique: Helical noncontrasted 5 mm sections were obtained from the   skull base to the vertex.    Findings: There is cerebral and cerebellar atrophy. Mild areas of  low   attenuation project within the subcortical, deep and periventricular   white matter regions. Focal area of low-attenuation projects along the   insular region on the left posteriorly. The ventricles and cisterns are   patent. There is no evidence  of mass effect, intra-axial or extra-axial     fluid collections or evidence of acute hemorrhage. The paranasal sinuses   and mastoid air cells are grossly unremarkable.    IMPRESSION:      1. Involutionalchanges without evidence of acute abnormality.  2. Dr. Rip Harbour of the Emergency department was informed of these findings   via a preliminary faxed report.    Thank you for the opportunity to contribute to the care of your patient.           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 This could have been some type of drug reaction or something else Parkinsonism-  pt has hx with gait problems over the past 20 yrs along with clinical findings concerning for idiopathic parkinsons.  Haldol can cloud this picture as it can cause parkinsonism as well. Gait problems-  this could all be related to 3. but there is concern for R hip pain contributing as well as we have to r/o myopathy with increased CPK.  Neuropathy will worsen as well Peripheral neuropathy-  most likely secondary to diabetes but other things can comfound this as well such as low B12 and hypothyroidism  give Sinemet trial of 25/100 mg TID with meals hold Haldol because this can worsen parkinsonian features PRN Seroquel 12.76m BID agree with PT assessment increase Neurotin to 3061mBID check CPK, myoglobin, TSH, B12/folate continue ASA 8133mor secondary stroke prevention  Electronic Signatures: SmiJamison NeighborD)  (Signed 23-Apr-13 12:34)  Authored: REFERRING PHYSICIAN, Primary Care Physician, Consult, History of Present Illness, Review of Systems, PAST MEDICAL/SURGICAL HISTORY, HOME MEDICATIONS, Current Medications, ALLERGIES, Social/Family History, NURSING VITAL SIGNS, Physical Exam-, LAB RESULTS, RADIOLOGY RESULTS, Recommendations   Last Updated: 23-Apr-13 12:34 by SmiJamison NeighborD)

## 2015-02-06 NOTE — H&P (Signed)
PATIENT NAME:  Micheal George, Micheal George MR#:  [id number] DATE OF BIRTH:  1925-08-10  DATE OF ADMISSION:  02/03/2012  PRIMARY CARE PHYSICIAN:  Dr. Corky Downs REFERRING PHYSICIAN:  Dr. Darnelle Catalan  CHIEF COMPLAINT: Status post fall.   HISTORY OF PRESENT ILLNESS: Mr. Micheal George is a pleasant an 81 year old male who lives at home with his wife and presents to the ED via EMS status post fall. The patient reports he had been trying to stand up from the bed when he felt weak. His legs could not carry him and he fell on the floor and could not stand up so his wife called his grandson who came and helped pick him up from the floor. The patient denies any dizziness, lightheadedness, loss of consciousness, chest pain, palpitations, or shortness of breath. He reports he just basically felt his legs were too weak to hold him.  The patient reports he tried to stand up again and he felt his legs were so weak that they could not carry him again, and so they called EMS who brought him to the hospital. The patient reports he has been complaining off cough with yellow productive sputum over the last few days.  He denies any fever, shortness of breath, or wheezing. The patient had a temperature of 100 upon admission to the ED. The patient did not have any evidence of opacities or infiltrate on the chest x-ray. The patient had BNP of 6421 but did not have any clinical symptoms of congestive heart failure exacerbation. There is no previous documentation of echo or history of congestive heart failure. The patient's creatinine was found to be 1.7. The patient does not know if he has any history of any renal problem or renal failure;  at the same time there are no previous labs we could compare his creatinine to. The patient reports he did see his primary care physician last week who prescribed Cipro but he did not start taking it yet.  The patient has also been reporting occasional dry blood in the stools but not melena, and reports the  amount is very minimal.  The patient is an extremely poor historian. His daughter at his bedside gave most of the history, but she does not know much of his medical history.   PAST MEDICAL HISTORY:  1. Chronic obstructive pulmonary disease.  2. Diabetes mellitus.  3. Neuropathy.  The patient or family cannot recall any other medical history at this point.   PAST SURGICAL HISTORY: Hemorrhoid surgery.   ALLERGIES: No known drug allergies.    MEDICATIONS:  Patient or family cannot recall any medication he. They will bring his medications in the morning.  The patient reports he is on insulin, does not know what kind of insulin and what dose, and he thinks he is on Glucophage as well and possibly Advair but he is uncertain.   SOCIAL HISTORY: The patient lives at home with his wife. He used to smoke but quit smoking in his 28s. No history of alcohol or illicit drug use.   FAMILY HISTORY: Noncontributory at this age.   REVIEW OF SYSTEMS: CONSTITUTIONAL:  Denies any fever. Complains of fatigue and weakness. EYES: Denies any blurry vision, double vision, or pain. ENT: Denies any tinnitus or ear pain. Has hearing loss and uses hearing aids. RESPIRATORY: Complains of cough with productive yellow sputum. Denies any wheezing or shortness of breath. CARDIOVASCULAR: Denies any chest pain, edema, arrhythmia, or palpitations. GASTROINTESTINAL: Denies any nausea or vomiting. Has complaints of mild  diarrhea with minimal amount of dry blood with stools. GENITOURINARY: Denies any dysuria, hematuria, or renal colic. ENDO: Denies any polyuria, polydipsia, or heat or cold intolerance. INTEGUMENT: Denies any acne, rash, or itching. NEURO: Denies any dysarthria, epilepsy, tremors, vertigo, any loss of consciousness, or any altered mental status. PSYCH: Denies any alcohol or drug abuse, any history of insomnia or anxiety.   PHYSICAL EXAMINATION:  VITAL SIGNS: Temperature 97.5, T-max 100, pulse 86, respiratory rate 22,  blood pressure 151/54, saturating 95% on room air.   GENERAL: Elderly male, comfortable, in no apparent distress.   HEENT: Head atraumatic, normocephalic. Pupils equal, reactive to light. Pink conjunctivae. Anicteric sclerae. Moist oral mucosa. Has hearing aids.   NECK: Supple. No thyromegaly. No JVD.   CHEST: Good air entry bilaterally. No wheezing, rales, or rhonchi.   CARDIOVASCULAR: S1, S2 heard. No rubs, murmur, or gallops.   ABDOMEN: Soft, nontender, nondistended. Bowel sounds present.   EXTREMITIES: No edema, no clubbing, no cyanosis.   NEUROLOGIC: Cranial nerves grossly intact. Motor five out of five in the four extremities.   PSYCHIATRIC: Appropriate affect. Awake, alert, oriented times three. Intact judgment and insight.   LABORATORY, DIAGNOSTIC, AND RADIOLOGICAL DATA: Glucose 106, BNP 6421, BUN 25, creatinine 1.7, sodium 143, potassium 4.9, chloride 113, CO2 18, calcium 8.4, total protein 7.1, albumin 3.5, alkaline phosphatase 65, AST 30, ALT 12, total CK 424, CK-MB 1.5, troponin 0.04. White blood cells 10.1, hemoglobin 12.2, hematocrit 37.4, platelets 144.  Chest x-ray: There is no evidence of pulmonary edema or vascular congestion or opacification or infiltrate.   ASSESSMENT AND PLAN:  1. Cough with productive yellow sputum most likely secondary to acute bronchitis. Will continue patient on erythromycin and Rocephin where at a later date they can be switched to p.o. antibiotics with discharge home on p.o. medications.  2. Gait disturbance, weakness. We will have physical therapy consult. This is most likely secondary to his bronchitis and possible dehydration.  3. Renal failure, unclear if acute or chronic. We will discuss with Dr. Juel BurrowMasoud in the a.m. who knows the patient's basic labs. Meanwhile we will continue him on IV normal saline and we will check BMP tomorrow.  4. Diabetes mellitus. The patient's medication at this point is unknown. We will start him on NovoLog sliding  scale before meals and at bedtime.  5. Occasional dark blood in the stools. This is most likely secondary to hemorrhoids. The patient can't recall the last time he had a colonoscopy. We will avoid chemical anticoagulation at this point. Hemoglobin is 12.2; baseline is unclear. We will repeat another hemoglobin level tomorrow and avoid any chemical anticoagulation. When Dr. Juel BurrowMasoud sees the patient tomorrow he will know his hemoglobin baseline and determine if any further work-up needs to be done as an inpatient or can be done as an outpatient.  6. CODE STATUS: The patient is FULL CODE.  7. Deep vein thrombosis prophylaxis with sequential compression device.    TOTAL TIME SPENT ON PATIENT CARE: 55 minutes.    ____________________________ Starleen Armsawood S. Jamori Biggar, MD dse:bjt D: 02/03/2012 05:19:26 ET T: 02/03/2012 10:00:54 ET JOB#: [id number]  cc: Starleen Armsawood S. Dellia Donnelly, MD, <Dictator> Corky DownsJaved Masoud, MD Opaline Reyburn Teena IraniS May Manrique MD ELECTRONICALLY SIGNED 02/04/2012 0:05

## 2015-02-06 NOTE — Discharge Summary (Signed)
PATIENT NAME:  Micheal George, Micheal George MR#:  [id number] DATE OF BIRTH:  1924-12-09  DATE OF ADMISSION:  02/04/2012 DATE OF DISCHARGE:  02/07/2012  HISTORY OF PRESENT ILLNESS: [name] is an 81 year old male who was admitted into the hospital with a history of fall at home. Patient was trying to get up from the bed and felt weak. His leg could not carry him so he fell on the floor. Patient was admitted in the hospital for further work-up and rehabilitation. Denies any history of chest pain, nausea, vomiting, or shortness of breath.  ADMITTING DIAGNOSES:  1. Gait imbalance. 2. Rule out stroke. 3. Renal failure. 4. Diabetes mellitus. 5. Hypothyroidism.   LABORATORY, DIAGNOSTIC AND RADIOLOGICAL DATA: Blood glucose 106, BNP 6421, BUN 25, creatinine 1.7, sodium 143, potassium 4.9, carbon dioxide 18, calcium 8.4. WBC count 10,100, hemoglobin 12.2, hematocrit 37.4. Chest x-ray doesn't show any evidence of congestive heart failure or pulmonary edema. Folic acid level 16.3. CPK 305. TSH 2.09. Blood sugar 187.   HOSPITAL COURSE: Patient was admitted into the hospital. He was watched for arrhythmia, at times he would have a run of bigeminy  so he was started on amiodarone 400 mg p.o. twice a day for one week and then it can be reduced to 200 mg p.o. daily. Arrhythmia is under control on the present medication. Patient had a CT scan of the brain which revealed cerebellar and cerebral atrophy. Ultrasound of the carotids show right carotid artery stenosis 75%. MRI of the back revealed degenerative disk disease at multiple levels. Patient was seen in consultation by neurologist and psychiatrist. It was decided that that he needed further rehab so he was discharged to [facility] for further rehab. The medication has been listed in the discharge instruction sheet.   FINAL DIAGNOSES:  1. Severe peripheral neuropathy causing gait imbalance.  2. Severe degenerative disk disease with spondylolisthesis upper back.   3. Right carotid artery stenosis, significant. 4. Parkinsonian disease.   ____________________________ Corky DownsJaved Connar Keating, MD jm:cms D: 02/07/2012 13:38:07 ET T: 02/07/2012 13:48:40 ET JOB#: [id number]  cc: Corky DownsJaved Maximos Zayas, MD, <Dictator> Corky DownsJAVED Latrenda Irani MD ELECTRONICALLY SIGNED 02/19/2012 8:05

## 2015-04-10 ENCOUNTER — Inpatient Hospital Stay: Payer: Medicare Other | Admitting: Anesthesiology

## 2015-04-10 ENCOUNTER — Inpatient Hospital Stay
Admission: EM | Admit: 2015-04-10 | Discharge: 2015-04-15 | DRG: 480 | Disposition: E | Payer: Medicare Other | Attending: Internal Medicine | Admitting: Internal Medicine

## 2015-04-10 ENCOUNTER — Inpatient Hospital Stay
Admit: 2015-04-10 | Discharge: 2015-04-10 | Disposition: A | Discharge status: Home / Self Care | Payer: Medicare Other | Attending: Internal Medicine | Admitting: Internal Medicine

## 2015-04-10 ENCOUNTER — Inpatient Hospital Stay: Payer: Medicare Other

## 2015-04-10 ENCOUNTER — Emergency Department: Payer: Medicare Other

## 2015-04-10 ENCOUNTER — Encounter: Admission: EM | Disposition: E | Payer: Self-pay | Source: Home / Self Care | Attending: Internal Medicine

## 2015-04-10 ENCOUNTER — Encounter: Payer: Self-pay | Admitting: Cardiovascular Disease

## 2015-04-10 DIAGNOSIS — M25552 Pain in left hip: Secondary | ICD-10-CM | POA: Diagnosis present

## 2015-04-10 DIAGNOSIS — IMO0002 Reserved for concepts with insufficient information to code with codable children: Secondary | ICD-10-CM

## 2015-04-10 DIAGNOSIS — I5033 Acute on chronic diastolic (congestive) heart failure: Secondary | ICD-10-CM | POA: Diagnosis present

## 2015-04-10 DIAGNOSIS — S72009A Fracture of unspecified part of neck of unspecified femur, initial encounter for closed fracture: Secondary | ICD-10-CM | POA: Diagnosis present

## 2015-04-10 DIAGNOSIS — J9589 Other postprocedural complications and disorders of respiratory system, not elsewhere classified: Secondary | ICD-10-CM | POA: Diagnosis present

## 2015-04-10 DIAGNOSIS — I469 Cardiac arrest, cause unspecified: Secondary | ICD-10-CM | POA: Diagnosis not present

## 2015-04-10 DIAGNOSIS — E039 Hypothyroidism, unspecified: Secondary | ICD-10-CM | POA: Diagnosis present

## 2015-04-10 DIAGNOSIS — I509 Heart failure, unspecified: Secondary | ICD-10-CM

## 2015-04-10 DIAGNOSIS — N189 Chronic kidney disease, unspecified: Secondary | ICD-10-CM

## 2015-04-10 DIAGNOSIS — Z01810 Encounter for preprocedural cardiovascular examination: Secondary | ICD-10-CM

## 2015-04-10 DIAGNOSIS — D759 Disease of blood and blood-forming organs, unspecified: Secondary | ICD-10-CM | POA: Diagnosis present

## 2015-04-10 DIAGNOSIS — R001 Bradycardia, unspecified: Secondary | ICD-10-CM | POA: Diagnosis present

## 2015-04-10 DIAGNOSIS — R410 Disorientation, unspecified: Secondary | ICD-10-CM | POA: Diagnosis present

## 2015-04-10 DIAGNOSIS — Z7982 Long term (current) use of aspirin: Secondary | ICD-10-CM | POA: Diagnosis not present

## 2015-04-10 DIAGNOSIS — E785 Hyperlipidemia, unspecified: Secondary | ICD-10-CM | POA: Diagnosis present

## 2015-04-10 DIAGNOSIS — I082 Rheumatic disorders of both aortic and tricuspid valves: Secondary | ICD-10-CM | POA: Diagnosis present

## 2015-04-10 DIAGNOSIS — N1330 Unspecified hydronephrosis: Secondary | ICD-10-CM

## 2015-04-10 DIAGNOSIS — I493 Ventricular premature depolarization: Secondary | ICD-10-CM | POA: Diagnosis present

## 2015-04-10 DIAGNOSIS — S72142A Displaced intertrochanteric fracture of left femur, initial encounter for closed fracture: Secondary | ICD-10-CM | POA: Diagnosis present

## 2015-04-10 DIAGNOSIS — J449 Chronic obstructive pulmonary disease, unspecified: Secondary | ICD-10-CM | POA: Diagnosis not present

## 2015-04-10 DIAGNOSIS — W1830XA Fall on same level, unspecified, initial encounter: Secondary | ICD-10-CM | POA: Diagnosis present

## 2015-04-10 DIAGNOSIS — Z9889 Other specified postprocedural states: Secondary | ICD-10-CM | POA: Diagnosis not present

## 2015-04-10 DIAGNOSIS — I350 Nonrheumatic aortic (valve) stenosis: Secondary | ICD-10-CM | POA: Diagnosis not present

## 2015-04-10 DIAGNOSIS — I5032 Chronic diastolic (congestive) heart failure: Secondary | ICD-10-CM | POA: Diagnosis not present

## 2015-04-10 DIAGNOSIS — K5900 Constipation, unspecified: Secondary | ICD-10-CM | POA: Diagnosis present

## 2015-04-10 DIAGNOSIS — Z79899 Other long term (current) drug therapy: Secondary | ICD-10-CM

## 2015-04-10 DIAGNOSIS — Y92009 Unspecified place in unspecified non-institutional (private) residence as the place of occurrence of the external cause: Secondary | ICD-10-CM | POA: Diagnosis not present

## 2015-04-10 DIAGNOSIS — I129 Hypertensive chronic kidney disease with stage 1 through stage 4 chronic kidney disease, or unspecified chronic kidney disease: Secondary | ICD-10-CM | POA: Diagnosis present

## 2015-04-10 DIAGNOSIS — E119 Type 2 diabetes mellitus without complications: Secondary | ICD-10-CM | POA: Diagnosis present

## 2015-04-10 DIAGNOSIS — I471 Supraventricular tachycardia: Secondary | ICD-10-CM | POA: Diagnosis present

## 2015-04-10 DIAGNOSIS — W19XXXA Unspecified fall, initial encounter: Secondary | ICD-10-CM

## 2015-04-10 DIAGNOSIS — Z87891 Personal history of nicotine dependence: Secondary | ICD-10-CM

## 2015-04-10 DIAGNOSIS — N179 Acute kidney failure, unspecified: Secondary | ICD-10-CM | POA: Diagnosis not present

## 2015-04-10 DIAGNOSIS — R0902 Hypoxemia: Secondary | ICD-10-CM | POA: Diagnosis not present

## 2015-04-10 DIAGNOSIS — J9601 Acute respiratory failure with hypoxia: Secondary | ICD-10-CM | POA: Diagnosis present

## 2015-04-10 DIAGNOSIS — D696 Thrombocytopenia, unspecified: Secondary | ICD-10-CM | POA: Diagnosis present

## 2015-04-10 DIAGNOSIS — N183 Chronic kidney disease, stage 3 (moderate): Secondary | ICD-10-CM | POA: Diagnosis present

## 2015-04-10 DIAGNOSIS — R609 Edema, unspecified: Secondary | ICD-10-CM | POA: Diagnosis present

## 2015-04-10 DIAGNOSIS — N170 Acute kidney failure with tubular necrosis: Secondary | ICD-10-CM | POA: Diagnosis present

## 2015-04-10 DIAGNOSIS — Z8249 Family history of ischemic heart disease and other diseases of the circulatory system: Secondary | ICD-10-CM | POA: Diagnosis not present

## 2015-04-10 DIAGNOSIS — Z7902 Long term (current) use of antithrombotics/antiplatelets: Secondary | ICD-10-CM | POA: Diagnosis not present

## 2015-04-10 DIAGNOSIS — R7989 Other specified abnormal findings of blood chemistry: Secondary | ICD-10-CM

## 2015-04-10 DIAGNOSIS — G629 Polyneuropathy, unspecified: Secondary | ICD-10-CM | POA: Diagnosis present

## 2015-04-10 DIAGNOSIS — R0602 Shortness of breath: Secondary | ICD-10-CM

## 2015-04-10 DIAGNOSIS — I959 Hypotension, unspecified: Secondary | ICD-10-CM | POA: Diagnosis present

## 2015-04-10 DIAGNOSIS — R092 Respiratory arrest: Secondary | ICD-10-CM | POA: Diagnosis present

## 2015-04-10 DIAGNOSIS — S72002A Fracture of unspecified part of neck of left femur, initial encounter for closed fracture: Secondary | ICD-10-CM

## 2015-04-10 HISTORY — DX: Nonrheumatic aortic (valve) stenosis: I35.0

## 2015-04-10 HISTORY — PX: INTRAMEDULLARY (IM) NAIL INTERTROCHANTERIC: SHX5875

## 2015-04-10 HISTORY — DX: Cardiomegaly: I51.7

## 2015-04-10 HISTORY — DX: Cardiac arrhythmia, unspecified: I49.9

## 2015-04-10 HISTORY — DX: Essential (primary) hypertension: I10

## 2015-04-10 HISTORY — DX: Hyperlipidemia, unspecified: E78.5

## 2015-04-10 HISTORY — DX: Hypothyroidism, unspecified: E03.9

## 2015-04-10 LAB — CBC WITH DIFFERENTIAL/PLATELET
Basophils Absolute: 0 10*3/uL (ref 0–0.1)
Basophils Relative: 1 %
Eosinophils Absolute: 0.3 10*3/uL (ref 0–0.7)
Eosinophils Relative: 4 %
HCT: 41.7 % (ref 40.0–52.0)
Hemoglobin: 13.6 g/dL (ref 13.0–18.0)
LYMPHS ABS: 1.8 10*3/uL (ref 1.0–3.6)
LYMPHS PCT: 23 %
MCH: 30.1 pg (ref 26.0–34.0)
MCHC: 32.7 g/dL (ref 32.0–36.0)
MCV: 92.1 fL (ref 80.0–100.0)
MONO ABS: 0.6 10*3/uL (ref 0.2–1.0)
MONOS PCT: 8 %
Neutro Abs: 5 10*3/uL (ref 1.4–6.5)
Neutrophils Relative %: 64 %
Platelets: 119 10*3/uL — ABNORMAL LOW (ref 150–440)
RBC: 4.52 MIL/uL (ref 4.40–5.90)
RDW: 17.2 % — AB (ref 11.5–14.5)
WBC: 7.9 10*3/uL (ref 3.8–10.6)

## 2015-04-10 LAB — BASIC METABOLIC PANEL
Anion gap: 6 (ref 5–15)
BUN: 42 mg/dL — AB (ref 6–20)
CO2: 22 mmol/L (ref 22–32)
Calcium: 9.1 mg/dL (ref 8.9–10.3)
Chloride: 114 mmol/L — ABNORMAL HIGH (ref 101–111)
Creatinine, Ser: 1.79 mg/dL — ABNORMAL HIGH (ref 0.61–1.24)
GFR calc Af Amer: 38 mL/min — ABNORMAL LOW (ref >60)
GFR calc non Af Amer: 32 mL/min — ABNORMAL LOW (ref >60)
GLUCOSE: 110 mg/dL — AB (ref 65–99)
POTASSIUM: 5 mmol/L (ref 3.5–5.1)
SODIUM: 142 mmol/L (ref 135–145)

## 2015-04-10 LAB — TYPE AND SCREEN
ABO/RH(D): A POS
Antibody Screen: NEGATIVE

## 2015-04-10 LAB — TROPONIN I: Troponin I: <0.03 ng/mL (ref <0.031)

## 2015-04-10 LAB — PROTIME-INR
INR: 1.27
Prothrombin Time: 16.1 seconds — ABNORMAL HIGH (ref 11.4–15.0)

## 2015-04-10 LAB — ABO/RH: ABO/RH(D): A POS

## 2015-04-10 LAB — MRSA PCR SCREENING: MRSA BY PCR: NEGATIVE

## 2015-04-10 LAB — GLUCOSE, CAPILLARY
Glucose-Capillary: 103 mg/dL — ABNORMAL HIGH (ref 65–99)
Glucose-Capillary: 239 mg/dL — ABNORMAL HIGH (ref 65–99)

## 2015-04-10 SURGERY — FIXATION, FRACTURE, INTERTROCHANTERIC, WITH INTRAMEDULLARY ROD
Anesthesia: General | Laterality: Left

## 2015-04-10 MED ORDER — METOCLOPRAMIDE HCL 5 MG PO TABS
5.0000 mg | ORAL_TABLET | Freq: Three times a day (TID) | ORAL | Status: DC | PRN
  Filled 2015-04-10: qty 1

## 2015-04-10 MED ORDER — HYDRALAZINE HCL 20 MG/ML IJ SOLN: 10.0000 mg | Freq: Four times a day (QID) | INTRAMUSCULAR | Status: DC | PRN

## 2015-04-10 MED ORDER — CLOPIDOGREL BISULFATE 75 MG PO TABS
75.0000 mg | ORAL_TABLET | Freq: Every day | ORAL | Status: DC
  Administered 2015-04-11 – 2015-04-13 (×3): 75 mg via ORAL
  Filled 2015-04-10 (×3): qty 1

## 2015-04-10 MED ORDER — METHYLPREDNISOLONE SODIUM SUCC 125 MG IJ SOLR
125.0000 mg | Freq: Once | INTRAMUSCULAR | Status: AC
  Administered 2015-04-10: 125 mg via INTRAVENOUS

## 2015-04-10 MED ORDER — VASOPRESSIN 20 UNIT/ML IV SOLN
INTRAVENOUS | Status: DC | PRN
  Administered 2015-04-10 (×2): 2 [IU] via INTRAVENOUS

## 2015-04-10 MED ORDER — ONDANSETRON HCL 4 MG/2ML IJ SOLN: 4.0000 mg | Freq: Once | INTRAMUSCULAR | Status: DC | PRN

## 2015-04-10 MED ORDER — DOCUSATE SODIUM 100 MG PO CAPS
100.0000 mg | ORAL_CAPSULE | Freq: Two times a day (BID) | ORAL | Status: DC
  Administered 2015-04-10 – 2015-04-13 (×5): 100 mg via ORAL
  Filled 2015-04-10 (×7): qty 1

## 2015-04-10 MED ORDER — FENTANYL CITRATE (PF) 100 MCG/2ML IJ SOLN
INTRAMUSCULAR | Status: AC
  Filled 2015-04-10: qty 2

## 2015-04-10 MED ORDER — SODIUM CHLORIDE 0.9 % IJ SOLN
3.0000 mL | Freq: Two times a day (BID) | INTRAMUSCULAR | Status: DC
  Administered 2015-04-10 – 2015-04-13 (×8): 3 mL via INTRAVENOUS

## 2015-04-10 MED ORDER — CEFAZOLIN SODIUM-DEXTROSE 2-3 GM-% IV SOLR
2.0000 g | Freq: Four times a day (QID) | INTRAVENOUS | Status: AC
  Administered 2015-04-10 – 2015-04-11 (×3): 2 g via INTRAVENOUS
  Filled 2015-04-10 (×4): qty 50

## 2015-04-10 MED ORDER — ALUM & MAG HYDROXIDE-SIMETH 200-200-20 MG/5ML PO SUSP: 30.0000 mL | Freq: Four times a day (QID) | ORAL | Status: DC | PRN

## 2015-04-10 MED ORDER — HYDRALAZINE HCL 50 MG PO TABS
50.0000 mg | ORAL_TABLET | Freq: Two times a day (BID) | ORAL | Status: DC
Start: 2015-04-10 — End: 2015-04-14
  Administered 2015-04-10 – 2015-04-13 (×6): 50 mg via ORAL
  Filled 2015-04-10 (×7): qty 1

## 2015-04-10 MED ORDER — FENTANYL CITRATE (PF) 100 MCG/2ML IJ SOLN
25.0000 ug | INTRAMUSCULAR | Status: DC | PRN
  Administered 2015-04-10: 25 ug via INTRAVENOUS

## 2015-04-10 MED ORDER — PANTOPRAZOLE SODIUM 40 MG PO TBEC
40.0000 mg | DELAYED_RELEASE_TABLET | Freq: Every day | ORAL | Status: DC
  Administered 2015-04-11 – 2015-04-13 (×2): 40 mg via ORAL
  Filled 2015-04-10 (×2): qty 1

## 2015-04-10 MED ORDER — METHYLPREDNISOLONE SODIUM SUCC 125 MG IJ SOLR
INTRAMUSCULAR | Status: AC
  Filled 2015-04-10: qty 2

## 2015-04-10 MED ORDER — ACETAMINOPHEN 650 MG RE SUPP: 650.0000 mg | Freq: Four times a day (QID) | RECTAL | Status: DC | PRN

## 2015-04-10 MED ORDER — EPINEPHRINE HCL 0.1 MG/ML IJ SOSY
PREFILLED_SYRINGE | INTRAMUSCULAR | Status: DC | PRN
  Administered 2015-04-10 (×2): 200 ug via INTRAVENOUS

## 2015-04-10 MED ORDER — CEFAZOLIN SODIUM-DEXTROSE 2-3 GM-% IV SOLR
INTRAVENOUS | Status: DC | PRN
  Administered 2015-04-10: 2 g via INTRAVENOUS

## 2015-04-10 MED ORDER — ASPIRIN EC 81 MG PO TBEC
81.0000 mg | DELAYED_RELEASE_TABLET | Freq: Every day | ORAL | Status: DC
  Administered 2015-04-11 – 2015-04-13 (×3): 81 mg via ORAL
  Filled 2015-04-10 (×3): qty 1

## 2015-04-10 MED ORDER — ONDANSETRON HCL 4 MG PO TABS: 4.0000 mg | ORAL_TABLET | Freq: Four times a day (QID) | ORAL | Status: DC | PRN

## 2015-04-10 MED ORDER — ONDANSETRON HCL 4 MG/2ML IJ SOLN
4.0000 mg | Freq: Four times a day (QID) | INTRAMUSCULAR | Status: DC | PRN
  Administered 2015-04-10: 4 mg via INTRAVENOUS

## 2015-04-10 MED ORDER — ENOXAPARIN SODIUM 40 MG/0.4ML ~~LOC~~ SOLN: 40.0000 mg | SUBCUTANEOUS | Status: DC

## 2015-04-10 MED ORDER — FUROSEMIDE 20 MG PO TABS: 20.0000 mg | ORAL_TABLET | Freq: Every day | ORAL | Status: DC

## 2015-04-10 MED ORDER — PHENYLEPHRINE HCL 10 MG/ML IJ SOLN
INTRAMUSCULAR | Status: DC | PRN
  Administered 2015-04-10 (×2): 200 ug via INTRAVENOUS

## 2015-04-10 MED ORDER — FLEET ENEMA 7-19 GM/118ML RE ENEM: 1.0000 | ENEMA | Freq: Once | RECTAL | Status: AC | PRN

## 2015-04-10 MED ORDER — FUROSEMIDE 10 MG/ML IJ SOLN
40.0000 mg | Freq: Once | INTRAMUSCULAR | Status: AC
  Administered 2015-04-10: 40 mg via INTRAVENOUS
  Filled 2015-04-10: qty 4

## 2015-04-10 MED ORDER — VASOPRESSIN 20 UNIT/ML IV SOLN
INTRAVENOUS | Status: AC
  Filled 2015-04-10: qty 2

## 2015-04-10 MED ORDER — SENNOSIDES-DOCUSATE SODIUM 8.6-50 MG PO TABS
1.0000 | ORAL_TABLET | Freq: Every evening | ORAL | Status: DC | PRN
  Administered 2015-04-12: 1 via ORAL
  Filled 2015-04-10: qty 1

## 2015-04-10 MED ORDER — METHYLPREDNISOLONE SODIUM SUCC 125 MG IJ SOLR
60.0000 mg | Freq: Three times a day (TID) | INTRAMUSCULAR | Status: DC
  Administered 2015-04-11 (×2): 60 mg via INTRAVENOUS
  Filled 2015-04-10 (×2): qty 2

## 2015-04-10 MED ORDER — OXYCODONE HCL 5 MG PO TABS
5.0000 mg | ORAL_TABLET | ORAL | Status: DC | PRN
  Administered 2015-04-11: 10 mg via ORAL
  Administered 2015-04-11 – 2015-04-12 (×5): 5 mg via ORAL
  Filled 2015-04-10 (×3): qty 1
  Filled 2015-04-10: qty 2
  Filled 2015-04-10 (×2): qty 1

## 2015-04-10 MED ORDER — PROPOFOL 10 MG/ML IV BOLUS
INTRAVENOUS | Status: DC | PRN
  Administered 2015-04-10: 50 mg via INTRAVENOUS

## 2015-04-10 MED ORDER — MOMETASONE FURO-FORMOTEROL FUM 100-5 MCG/ACT IN AERO
2.0000 | INHALATION_SPRAY | Freq: Two times a day (BID) | RESPIRATORY_TRACT | Status: DC
  Administered 2015-04-10 – 2015-04-13 (×8): 2 via RESPIRATORY_TRACT
  Filled 2015-04-10: qty 8.8

## 2015-04-10 MED ORDER — KCL IN DEXTROSE-NACL 20-5-0.9 MEQ/L-%-% IV SOLN
INTRAVENOUS | Status: DC
  Administered 2015-04-10: 18:00:00 via INTRAVENOUS
  Filled 2015-04-10 (×4): qty 1000

## 2015-04-10 MED ORDER — INSULIN ASPART 100 UNIT/ML ~~LOC~~ SOLN
0.0000 [IU] | Freq: Every day | SUBCUTANEOUS | Status: DC
  Administered 2015-04-10: 2 [IU] via SUBCUTANEOUS
  Filled 2015-04-10: qty 2

## 2015-04-10 MED ORDER — HYDROCODONE-ACETAMINOPHEN 5-325 MG PO TABS: 1.0000 | ORAL_TABLET | ORAL | Status: DC | PRN

## 2015-04-10 MED ORDER — MAGNESIUM HYDROXIDE 400 MG/5ML PO SUSP: 30.0000 mL | Freq: Every day | ORAL | Status: DC | PRN

## 2015-04-10 MED ORDER — FENTANYL CITRATE (PF) 100 MCG/2ML IJ SOLN
INTRAMUSCULAR | Status: DC | PRN
  Administered 2015-04-10: 50 ug via INTRAVENOUS
  Administered 2015-04-10 (×3): 25 ug via INTRAVENOUS

## 2015-04-10 MED ORDER — INSULIN ASPART 100 UNIT/ML ~~LOC~~ SOLN
0.0000 [IU] | Freq: Three times a day (TID) | SUBCUTANEOUS | Status: DC
  Administered 2015-04-10: 1 [IU] via SUBCUTANEOUS
  Administered 2015-04-11 (×2): 7 [IU] via SUBCUTANEOUS
  Administered 2015-04-11 – 2015-04-12 (×2): 5 [IU] via SUBCUTANEOUS
  Administered 2015-04-12: 2 [IU] via SUBCUTANEOUS
  Filled 2015-04-10: qty 1
  Filled 2015-04-10: qty 9
  Filled 2015-04-10 (×2): qty 5
  Filled 2015-04-10 (×2): qty 7

## 2015-04-10 MED ORDER — SIMVASTATIN 20 MG PO TABS
20.0000 mg | ORAL_TABLET | Freq: Every day | ORAL | Status: DC
  Administered 2015-04-10 – 2015-04-13 (×3): 20 mg via ORAL
  Filled 2015-04-10 (×3): qty 1

## 2015-04-10 MED ORDER — MIDAZOLAM HCL 2 MG/2ML IJ SOLN
INTRAMUSCULAR | Status: DC | PRN
  Administered 2015-04-10: 1 mg via INTRAVENOUS

## 2015-04-10 MED ORDER — ACETAMINOPHEN 325 MG PO TABS: 650.0000 mg | ORAL_TABLET | Freq: Four times a day (QID) | ORAL | Status: DC | PRN

## 2015-04-10 MED ORDER — NEOMYCIN-POLYMYXIN B GU 40-200000 IR SOLN
Status: DC | PRN
  Administered 2015-04-10: 10 mL via INTRAMUSCULAR

## 2015-04-10 MED ORDER — EPHEDRINE SULFATE 50 MG/ML IJ SOLN
INTRAMUSCULAR | Status: DC | PRN
  Administered 2015-04-10 (×2): 25 mg via INTRAVENOUS

## 2015-04-10 MED ORDER — LACTATED RINGERS IV SOLN
INTRAVENOUS | Status: DC | PRN
  Administered 2015-04-10: 12:00:00 via INTRAVENOUS

## 2015-04-10 MED ORDER — IPRATROPIUM-ALBUTEROL 0.5-2.5 (3) MG/3ML IN SOLN
3.0000 mL | Freq: Four times a day (QID) | RESPIRATORY_TRACT | Status: DC
  Administered 2015-04-10 – 2015-04-13 (×12): 3 mL via RESPIRATORY_TRACT
  Filled 2015-04-10 (×12): qty 3

## 2015-04-10 MED ORDER — GABAPENTIN 100 MG PO CAPS
100.0000 mg | ORAL_CAPSULE | Freq: Every day | ORAL | Status: DC
  Administered 2015-04-10 – 2015-04-13 (×4): 100 mg via ORAL
  Filled 2015-04-10 (×4): qty 1

## 2015-04-10 MED ORDER — LEVOTHYROXINE SODIUM 50 MCG PO TABS
50.0000 ug | ORAL_TABLET | Freq: Every day | ORAL | Status: DC
  Administered 2015-04-11 – 2015-04-13 (×3): 50 ug via ORAL
  Filled 2015-04-10 (×3): qty 1

## 2015-04-10 MED ORDER — METOCLOPRAMIDE HCL 5 MG/ML IJ SOLN
5.0000 mg | Freq: Three times a day (TID) | INTRAMUSCULAR | Status: DC | PRN
  Filled 2015-04-10: qty 2

## 2015-04-10 MED ORDER — DIPHENHYDRAMINE HCL 12.5 MG/5ML PO ELIX
12.5000 mg | ORAL_SOLUTION | ORAL | Status: DC | PRN
  Filled 2015-04-10: qty 10

## 2015-04-10 MED ORDER — MORPHINE SULFATE 2 MG/ML IJ SOLN
2.0000 mg | INTRAMUSCULAR | Status: DC | PRN
  Administered 2015-04-10: 2 mg via INTRAVENOUS
  Filled 2015-04-10: qty 1

## 2015-04-10 MED ORDER — HYDROMORPHONE HCL 1 MG/ML IJ SOLN: 0.2000 mg | INTRAMUSCULAR | Status: DC | PRN

## 2015-04-10 MED ORDER — HYDROMORPHONE HCL 1 MG/ML IJ SOLN
0.2500 mg | INTRAMUSCULAR | Status: DC | PRN
  Administered 2015-04-12 – 2015-04-13 (×3): 0.25 mg via INTRAVENOUS
  Administered 2015-04-13: 0.5 mg via INTRAVENOUS
  Filled 2015-04-10 (×4): qty 1

## 2015-04-10 MED ORDER — BUPIVACAINE HCL (PF) 0.5 % IJ SOLN
INTRAMUSCULAR | Status: AC
  Filled 2015-04-10: qty 30

## 2015-04-10 SURGICAL SUPPLY — 39 items
AFFIXUS BALL NOSE GUIDE WIRE ×3 IMPLANT
ARROW ARTERIAL LINE ×3 IMPLANT
Affixus left hip fracture nail (Nail) ×2 IMPLANT
BNDG COHESIVE 4X5 TAN STRL (GAUZE/BANDAGES/DRESSINGS) ×3 IMPLANT
BNDG COHESIVE 6X5 TAN STRL LF (GAUZE/BANDAGES/DRESSINGS) ×3 IMPLANT
CANISTER SUCT 1200ML W/VALVE (MISCELLANEOUS) ×3 IMPLANT
CATH TRAY 16F METER LATEX (MISCELLANEOUS) ×3 IMPLANT
CHLORAPREP W/TINT 26ML (MISCELLANEOUS) ×3 IMPLANT
DRAPE SHEET LG 3/4 BI-LAMINATE (DRAPES) ×3 IMPLANT
DRAPE SURG 17X11 SM STRL (DRAPES) ×3 IMPLANT
DRAPE U-SHAPE 47X51 STRL (DRAPES) IMPLANT
DRSG OPSITE POSTOP 4X6 (GAUZE/BANDAGES/DRESSINGS) ×6 IMPLANT
ELECT CAUTERY BLADE 6.4 (BLADE) ×3 IMPLANT
GAUZE SPONGE 4X4 12PLY STRL (GAUZE/BANDAGES/DRESSINGS) IMPLANT
GLOVE BIO SURGEON STRL SZ8 (GLOVE) ×9 IMPLANT
GLOVE INDICATOR 8.0 STRL GRN (GLOVE) ×3 IMPLANT
GOWN STRL REUS W/ TWL LRG LVL3 (GOWN DISPOSABLE) ×1 IMPLANT
GOWN STRL REUS W/ TWL XL LVL3 (GOWN DISPOSABLE) ×1 IMPLANT
GOWN STRL REUS W/TWL LRG LVL3 (GOWN DISPOSABLE) ×2
GOWN STRL REUS W/TWL XL LVL3 (GOWN DISPOSABLE) ×2
GUIDEPIN VERSANAIL DSP 3.2X444 ×3 IMPLANT
GUIDEWIRE BALL NOSE 100CM (WIRE) ×3 IMPLANT
HIP FRAC NAIL LAG SCR 10.5X100 (Orthopedic Implant) ×2 IMPLANT
MAT BLUE FLOOR 46X72 FLO (MISCELLANEOUS) ×3 IMPLANT
NAIL HIP FRACT LT 130D 11X400 (Nail) ×3 IMPLANT
NEEDLE FILTER BLUNT 18X 1/2SAF (NEEDLE)
NEEDLE FILTER BLUNT 18X1 1/2 (NEEDLE) IMPLANT
NS IRRIG 500ML POUR BTL (IV SOLUTION) ×3 IMPLANT
PACK HIP COMPR (MISCELLANEOUS) ×3 IMPLANT
PAD GROUND ADULT SPLIT (MISCELLANEOUS) ×3 IMPLANT
SCREW CANN THRD AFF 10.5X100 (Orthopedic Implant) ×1 IMPLANT
STAPLER SKIN PROX 35W (STAPLE) ×3 IMPLANT
STRAP SAFETY BODY (MISCELLANEOUS) ×3 IMPLANT
SUT VIC AB 1 CT1 36 (SUTURE) ×3 IMPLANT
SUT VIC AB 2-0 CT1 (SUTURE) ×3 IMPLANT
SYRINGE 10CC LL (SYRINGE) ×3 IMPLANT
TAPE MICROFOAM 4IN (TAPE) IMPLANT
VERSANAIL THREADED GUIDE PIN IMPLANT
affixus lag screw 10.5x100mm ×3 IMPLANT

## 2015-04-10 NOTE — Progress Notes (Signed)
*  PRELIMINARY RESULTS* Echocardiogram 2D Echocardiogram has been performed.  Micheal George 03/29/2015, 10:29 AM

## 2015-04-10 NOTE — Consult Note (Addendum)
Patient ID: Micheal George MRN: [id number] DOB/AGE: 79-Sep-1926 79 y.o.  Admit date: 04/09/2015 Referring Physician: Mody Primary Cardiologist: Nahser Reason for Consultation: Pre-operative risk assessment prior to hip surgery  HPI: 81 yo male with history of moderate aortic valve stenosis by echo 2013, LVH, DM, HLD, HTN, PVCs with ventricular bigeminy, COPD and hypothyroidism who is admitted to [facility] after tripping and falling and sustaining a left hip fracture. He has been seen remotely in our office by Dr. Elease Hashimoto but not since 2013. He has been followed most recently by Dr. Harl Bowie but his family is asking [facility] to see him today and would like to move his care over to our practice.   He denies chest pain or change in breathing. No recent dizziness, near syncope or syncope. He is with his daughters who report that he is very functional at baseline, walking with a cane.    Past Medical History  Diagnosis Date  . COPD (chronic obstructive pulmonary disease)   . Diabetes mellitus   . Neuropathy   . Thyroid disease     hypothyroidism  . Renal failure   . Unsteady gait   . HTN (hypertension)   . Hyperlipidemia   . Dysrhythmia   . Hypothyroidism   . Aortic stenosis   . LVH (left ventricular hypertrophy)     Family History  Problem Relation Age of Onset  . Heart disease Father     History   Social History  . Marital Status: Single    Spouse Name: N/A  . Number of Children: N/A  . Years of Education: N/A   Occupational History  . Not on file.   Social History Main Topics  . Smoking status: Former Smoker    Types: Cigarettes, Pipe    Quit date: 04/09/1964  . Smokeless tobacco: Not on file  . Alcohol Use: No  . Drug Use: No  . Sexual Activity: Not on file   Other Topics Concern  . Not on file   Social History Narrative    Past Surgical History  Procedure Laterality Date  . Hemorrhoid surgery  1975    No Known Allergies   . furosemide  20 mg Oral  Daily  . hydrALAZINE  50 mg Oral BID  . insulin aspart  0-5 Units Subcutaneous QHS  . insulin aspart  0-9 Units Subcutaneous TID WC  . levothyroxine  50 mcg Oral QAC breakfast  . mometasone-formoterol  2 puff Inhalation BID  . pantoprazole  40 mg Oral Daily  . simvastatin  20 mg Oral Daily  . sodium chloride  3 mL Intravenous Q12H    Prior to Admission medications   Medication Sig Start Date End Date Taking? Authorizing Provider  aspirin EC 81 MG tablet Take 81 mg by mouth daily.   Yes Historical Provider, MD  clopidogrel (PLAVIX) 75 MG tablet Take 75 mg by mouth daily.   Yes Historical Provider, MD  furosemide (LASIX) 20 MG tablet Take 20 mg by mouth daily.   Yes Historical Provider, MD  gabapentin (NEURONTIN) 100 MG capsule Take 100 mg by mouth at bedtime.    Yes Historical Provider, MD  hydrALAZINE (APRESOLINE) 50 MG tablet Take 50 mg by mouth 2 (two) times daily.   Yes Historical Provider, MD  insulin detemir (LEVEMIR) 100 UNIT/ML injection Inject 20 Units into the skin at bedtime.   Yes Historical Provider, MD  levothyroxine (SYNTHROID, LEVOTHROID) 50 MCG tablet Take 50 mcg by mouth daily before breakfast.  Yes Historical Provider, MD  simvastatin (ZOCOR) 20 MG tablet Take 20 mg by mouth at bedtime.    Yes Historical Provider, MD    Review of systems complete and found to be negative unless listed above    Physical Exam: Blood pressure 155/71, pulse 98, temperature 97.8 F (36.6 C), temperature source Oral, resp. rate 18, height  (1.753 m), weight 160 lb (72.576 kg), SpO2 91 %.    General: Well developed, well nourished, NAD  HEENT: OP clear, mucus membranes moist  SKIN: warm, dry. No rashes.  Neuro: No focal deficits  Musculoskeletal: Muscle strength 5/5 all ext  Psychiatric: Mood and affect normal  Neck: No JVD, no carotid bruits, no thyromegaly, no lymphadenopathy.  Lungs:Clear bilaterally, no wheezes, rhonci, crackles  Cardiovascular: Regular rate and rhythm with  ectopy. Sysolic murmurnoted. No gallops or rubs.  Abdomen:Soft. Bowel sounds present. Non-tender.  Extremities: No lower extremity edema. Pulses are 2 + in the bilateral DP/PT.   Labs:   Lab Results  Component Value Date   WBC 7.9 05-May-2015   HGB 13.6 May 05, 2015   HCT 41.7 05-May-2015   MCV 92.1 05-05-15   PLT 119* 05-05-15     Recent Labs Lab 05-05-2015 0426  NA 142  K 5.0  CL 114*  CO2 22  BUN 42*  CREATININE 1.79*  CALCIUM 9.1  GLUCOSE 110*   Lab Results  Component Value Date   TROPONINI <0.03 2015/05/05    Venous dopplers: No DVT   Chest x-ray: Vascular congestion and borderline cardiomegaly, with mildly increased interstitial markings again noted. This may reflect minimal interstitial edema. No displaced rib fracture seen.  EKG: Sinus, PVCs, Non-specific ST and T wave abnormalities  ASSESSMENT AND PLAN:  81 yo male with history of DM, HLD, HTN without documented CAD with history of PVCs/Bigeminy, COPD and renal insufficiency on chronic Plavix who is admitted after a fall and found to have a left hip fracture. I am asked to see him to review his cardiac risk with the planned hip repair later today  1. Pre-operative risk assessment: He has no known ischemic heart disease. He is treated for diastolic CHF with Lasix daily, mostly manifesting as lower extremity edema. He has had no change in his breathing recently and has had no chest pain to suggest angina. He is at least moderately mobile at baseline, using a cane to walk. He does take ASA and Plavix daily and will be at higher risk of post-operative bleeding. I would proceed to hip repair without further cardiac workup today. He is at least at moderate risk for any surgical procedure given his advanced age but he and his family understand that he will not do well if he does not have the surgical repair of his hip fracture and they are willing to proceed, while understanding the associated risk.   2. PVCs: He has a  long history of PVCs with bigeminy. May need to use prn Lopressor in the peri-operative period if he has increase in frequency of PVCs.   3. Hip fracture: per ortho  4. Aortic stenosis: Mild to moderate by echo 2013. More recent echo in Dr. Renie Ora office. Echo is reviewed with anesthesiology before the case. He has a thickened, calcified aortic valve with moderate to severe stenosis. Mean gradient . There is moderate AI.   We will follow along during his hospitalization   Signed: Verne Carrow, MD 05-May-2015, 11:17 AM

## 2015-04-10 NOTE — Clinical Social Work Note (Signed)
Clinical Social Worker consulted for Hip Fracture. Pt will likely need SNF. Surgery 03/29/2015. FL2 is complete and on the chart. Full assessment to follow. CSW will continue to follow.   Dede Query, MSW, LCSW Clinical Social Worker [phone]

## 2015-04-10 NOTE — OR Nursing (Signed)
Patient had a chest X-ray and an abg

## 2015-04-10 NOTE — ED Notes (Signed)
EMS pt from home after a fall; pt says he was getting up to use the bathroom, reached for his cane and stumbled into the hallway; c/o left hip pain

## 2015-04-10 NOTE — ED Provider Notes (Signed)
[facility] Emergency Department Provider Note  ____________________________________________  Time seen: Approximately 4:28 AM  I have reviewed the triage vital signs and the nursing notes.   HISTORY  Chief Complaint Fall and Hip Pain    HPI Micheal George is a 81 y.o. male via EMS from home s/p mechanical fall. Patient states he was getting up to use the restroom, reach for his cane and lost his balance. Patient fell onto his left hip. Denies LOC. Patient with a history of COPD who is supposed to be on home oxygen; however, he does not wear it as directed by his doctor. Denies fever, chills, chest pain, shortness of breath, abdominal pain, vomiting, diarrhea, headache, numbness, tingling. Per EMS, room air saturations 86%. Nothing makes the pain better. Movement makes the pain worse.  Past Medical History  Diagnosis Date  . COPD (chronic obstructive pulmonary disease)   . Diabetes mellitus   . Neuropathy   . Thyroid disease     hypothyroidism  . Renal failure   . Unsteady gait     Patient Active Problem List   Diagnosis Date Noted  . Ventricular bigeminy 03/18/2012  . Diabetes mellitus 03/18/2012  . Parkinson's disease 03/18/2012    Past Surgical History  Procedure Laterality Date  . Hemorrhoid surgery      Current Outpatient Rx  Name  Route  Sig  Dispense  Refill  . acetaminophen (TYLENOL) 325 MG tablet   Oral   Take 650 mg by mouth every 6 (six) hours as needed.         Marland Kitchen aspirin 325 MG tablet   Oral   Take 325 mg by mouth daily.         . Carbidopa-Levodopa (SINEMET PO)      25/100 mg take one tablet every 6 hours         . Fluticasone-Salmeterol (ADVAIR) 100-50 MCG/DOSE AEPB   Inhalation   Inhale 1 puff into the lungs daily as needed.         . gabapentin (NEURONTIN) 100 MG capsule   Oral   Take 100 mg by mouth 2 (two) times daily.         . GuaiFENesin (MUCUS RELIEF ADULT PO)   Oral   Take by mouth as needed.        . insulin glargine (LANTUS SOLOSTAR) 100 UNIT/ML injection   Subcutaneous   Inject 36 Units into the skin every morning.         . insulin glargine (LANTUS SOLOSTAR) 100 UNIT/ML injection   Subcutaneous   Inject 17 Units into the skin at bedtime.         . Loratadine (CLARITIN) 10 MG CAPS   Oral   Take 10 mg by mouth as needed.          . metFORMIN (GLUCOPHAGE) 1000 MG tablet   Oral   Take 1,000 mg by mouth 2 (two) times daily with a meal.         . omeprazole (PRILOSEC) 20 MG capsule   Oral   Take 20 mg by mouth daily.         . simvastatin (ZOCOR) 20 MG tablet   Oral   Take 20 mg by mouth daily.           Allergies Review of patient's allergies indicates no known allergies.  Family History  Problem Relation Age of Onset  . Heart disease Father     Social History History  Substance  Use Topics  . Smoking status: Former Smoker    Types: Cigarettes, Pipe  . Smokeless tobacco: Not on file  . Alcohol Use: No    Review of Systems Constitutional: No fever/chills Eyes: No visual changes. ENT: No sore throat. Cardiovascular: Denies chest pain. Respiratory: Denies shortness of breath. Gastrointestinal: No abdominal pain.  No nausea, no vomiting.  No diarrhea.  No constipation. Genitourinary: Negative for dysuria. Musculoskeletal: Positive for left hip pain. Negative for back pain. Skin: Negative for rash. Neurological: Negative for headaches, focal weakness or numbness.  10-point ROS otherwise negative.  ____________________________________________   PHYSICAL EXAM:  VITAL SIGNS: ED Triage Vitals  Enc Vitals Group     BP 04/05/2015 0414 155/79 mmHg     Pulse Rate 04/08/2015 0414 55     Resp 03/30/2015 0414 18     Temp --      Temp src --      SpO2 04/09/2015 0414 89 %     Weight 04/07/2015 0414 160 lb (72.576 kg)     Height 03/27/2015 0414 5\' 9"  (1.753 m)     Head Cir --      Peak Flow --      Pain Score 04/11/2015 0415 2     Pain Loc --       Pain Edu? --      Excl. in GC? --     Constitutional: Alert and oriented. Pleasant and in mild acute distress. Eyes: Conjunctivae are normal. PERRL. EOMI. Head: Atraumatic. Nose: No congestion/rhinnorhea. Mouth/Throat: Mucous membranes are moist.  Oropharynx non-erythematous. Neck: No stridor. No cervical spine tenderness to palpation. Cardiovascular: Normal rate, regular rhythm. Grossly normal heart sounds.  Good peripheral circulation. Respiratory: Normal respiratory effort.  No retractions. Lungs CTAB. Gastrointestinal: Soft and nontender. No distention. No abdominal bruits. No CVA tenderness. Musculoskeletal: Left leg externally rotated and shortened. 2+ femoral pulse. Maximally tender left inguinal region. Neurologic:  Normal speech and language. No gross focal neurologic deficits are appreciated. Speech is normal.  Skin:  Skin is warm, dry and intact. No rash noted. Psychiatric: Mood and affect are normal. Speech and behavior are normal.  ____________________________________________   LABS (all labs ordered are listed, but only abnormal results are displayed)  Labs Reviewed  CBC WITH DIFFERENTIAL/PLATELET - Abnormal; Notable for the following:    RDW 17.2 (*)    Platelets 119 (*)    All other components within normal limits  BASIC METABOLIC PANEL  TROPONIN I  PROTIME-INR  TYPE AND SCREEN   ____________________________________________  EKG  ED ECG REPORT I, Jasreet Dickie J, the attending physician, personally viewed and interpreted this ECG.   Date: 03/17/2015  EKG Time: 0533  Rate: 87  Rhythm: normal EKG, normal sinus rhythm, frequent PVC's noted  Axis: Normal  Intervals:none  ST&T Change: Nonspecific  ____________________________________________  RADIOLOGY  Left hip x-rays (viewed by me, interpreted by Dr. Cherly Hensen): Displaced mildly comminuted intertrochanteric fracture involving the proximal left femur, with extension into the proximal femoral diaphysis. Mild  lateral displacement of the distal femur.  Chest 1 view (viewed by me, interpreted by Dr. Lenis Noon): Vascular congestion and borderline cardiomegaly, with mildly increased interstitial markings again noted. This may reflect minimal interstitial edema. No displaced rib fracture seen.  ____________________________________________   PROCEDURES  Procedure(s) performed: None  Critical Care performed: No  ____________________________________________   INITIAL IMPRESSION / ASSESSMENT AND PLAN / ED COURSE  Pertinent labs & imaging results that were available during my care of the patient were reviewed by me  and considered in my medical decision making (see chart for details).  81 year old male s/p mechanical fall with left hip injury. Clinical exam suspicious for acute left hip fracture. Will obtain hip x-rays, chest x-ray and screening labs. Anticipate hospital admission for hip fracture.  ----------------------------------------- 5:26 AM on 04/09/2015 -----------------------------------------  Updated patient of x-ray results. Patient currently declines analgesics. Discussed with Dr. Joice Lofts from orthopedics and Dr. Sheryle Hail who will evaluate patient in the ED for hospital admission. ____________________________________________   FINAL CLINICAL IMPRESSION(S) / ED DIAGNOSES  Final diagnoses:  Fall, initial encounter  Intertrochanteric fracture of left hip, closed, initial encounter  Chronic obstructive pulmonary disease, unspecified COPD, unspecified chronic bronchitis type  Hypoxia      Irean Hong, MD 03/29/2015 [phone]

## 2015-04-10 NOTE — OR Nursing (Signed)
Patient transferred to icu 13

## 2015-04-10 NOTE — Op Note (Signed)
04/06/2015  2:29 PM  Patient:   Micheal George  Pre-Op Diagnosis:   Two-part intertrochanteric fracture, left hip.  Post-Op Diagnosis:   Same  Procedure:   Reduction and internal fixation of right hip fracture with Biomet Affyxis TFN nail.  Surgeon:   Maryagnes Amos, MD  Assistant:   None  Anesthesia:   General LMA  Findings:   As above  Complications:   None  EBL:   50 cc  Fluids:   900 cc crystalloid  UOP:   450 cc  TT:   None  Drains:   None  Closure:   Staples  Implants:   Biomet Affyxis 11x400 mm TFN with 100 mm lag screw  Brief Clinical Note:   The patient is an 81 year old male who lives at home with his wife. Apparently, he fell while getting up last night to go to the bathroom and injured his left hip. X-rays in the emergency room demonstrated an intertrochanteric fracture of the left hip. He has been cleared medically and by cardiology, and presents at this time for reduction and internal fixation of the left hip fracture.  Procedure:   The patient was brought into the operating room. After adequate general laryngal mask anesthesia was obtained, the patient's blood pressure suddenly bottomed out. He responded to IV pressors and reduction of his anesthesia. However, the anesthesiologist felt it best to proceed with placing in a line for closer blood pressure monitoring prior to proceeding with the case. After several attempts at placing an a line in both radial arteries, an ultrasound was brought in. The ultrasound showed that both radial arteries were relatively stenosed. Therefore, a femoral A-line was placed. Once this was placed successfully, the patient was transferred to the fracture table. The  leg was placed in a flexed and abducted position while the left lower extremity was placed in longitudinal traction. The fracture was reduced using longitudinal traction and internal rotation. The adequacy of reduction was verified fluoroscopically in AP and lateral  projections and found to be near anatomic. The lateral aspects of the left hip and thigh were prepped with ChloraPrep solution before being draped sterilely. Preoperative antibiotics were administered. The greater trochanter was identified fluoroscopically and an approximately 3 cm incision made approximately 2-3 fingerbreadths above the tip of the greater trochanter. The incision was carried down through the subcutaneous tissues to expose the gluteal fascia. This was split the length of the incision, providing access to the tip of the trochanter. Under fluoroscopic guidance, a guidewire was drilled through the tip of the trochanter into the proximal metaphysis to the level of the lesser trochanter. After verifying its position fluoroscopically in AP and lateral projections, it was overreamed with the initial reamer to the depth of the lesser trochanter. A beaded guidewire was passed down through the femoral canal to the supracondylar region. The adequacy of guidewire position was verified fluoroscopically in AP and lateral projections before the length of the guidewire within the canal was measured and found to be 400 mm.  The guidewire was overreamed sequentially using the flexible reamers, beginning with a 10 mm reamer and progressing to a 12.5 mm reamer. This provided good cortical chatter. The 11 x 400 mm Biomet Affyxis TFN rod was selected and advanced to the appropriate depth, as verified fluoroscopically. The guide system for the lag screw was positioned and advanced through an approximately 2 cm stab incision over the lateral aspect of the proximal femur. The guidewire was drilled up through the  trochanteric femoral nail and into the femoral neck to rest within 5 mm of subchondral bone. This guidewire was measured and found to be optimally replicated by a 100 mm lag screw. The guidewire was overreamed to the appropriate depth before the lag screw was inserted and advanced to the appropriate depth as  verified fluoroscopically in AP and lateral projections. The locking screw was advanced, then backed off a quarter turn to set the lag screw before the guide system was removed. Again the adequacy of hardware position and fracture reduction was verified fluoroscopically in AP and lateral projections and found to be excellent.  Given the excellent fit of the intramedullary nail through the isthmus, the inherent stability of the fracture, and the patient's tenuous stability under anesthesia, it was elected not to take the extra time to place a locking screw distally. The wounds were irrigated thoroughly with sterile saline solution before the deeper subcutaneous tissues were closed using 2-0 Vicryl interrupted sutures. The skin was closed using staples. Sterile occlusive honeycomb dressings were applied to both wounds before the patient was transferred back to his hospital bed. He was then brought to the recovery room in satisfactory condition after tolerating the procedure reasonably well.

## 2015-04-10 NOTE — Anesthesia Preprocedure Evaluation (Addendum)
Anesthesia Evaluation  Patient identified by MRN, date of birth, ID band Patient awake    Reviewed: Allergy & Precautions, NPO status , Patient's Chart, lab work & pertinent test results  History of Anesthesia Complications Negative for: history of anesthetic complications  Airway Mallampati: II  TM Distance: >3 FB Neck ROM: Full    Dental  (+) Edentulous Upper, Edentulous Lower   Pulmonary COPD oxygen dependent, former smoker,  2L O2 via [place] at home breath sounds clear to auscultation  Pulmonary exam normal       Cardiovascular Exercise Tolerance: Poor hypertension, Pt. on medications +CHF + dysrhythmias + Valvular Problems/Murmurs AS Rhythm:Regular Rate:Normal + Systolic murmurs -Diastolic CHF -ventricular bigemeny -preliminary echo report from cardiologist with moderate aortic stenosis   Neuro/Psych CVA, No Residual Symptoms negative psych ROS   GI/Hepatic negative GI ROS, Neg liver ROS,   Endo/Other  diabetes, Well Controlled, Type 2, Insulin DependentHypothyroidism   Renal/GU Renal InsufficiencyRenal disease  negative genitourinary   Musculoskeletal negative musculoskeletal ROS (+)   Abdominal   Peds negative pediatric ROS (+)  Hematology negative hematology ROS (+)   Anesthesia Other Findings   Reproductive/Obstetrics negative OB ROS                           Anesthesia Physical Anesthesia Plan  ASA: III  Anesthesia Plan: General   Post-op Pain Management:    Induction: Intravenous  Airway Management Planned: LMA  Additional Equipment:   Intra-op Plan:   Post-operative Plan: Extubation in OR and Possible Post-op intubation/ventilation  Informed Consent: I have reviewed the patients History and Physical, chart, labs and discussed the procedure including the risks, benefits and alternatives for the proposed anesthesia with the patient or authorized representative who has  indicated his/her understanding and acceptance.     Plan Discussed with: CRNA and Surgeon  Anesthesia Plan Comments:        Anesthesia Quick Evaluation

## 2015-04-10 NOTE — Transfer of Care (Signed)
Immediate Anesthesia Transfer of Care Note  Patient: Micheal George  Procedure(s) Performed: Procedure(s): INTRAMEDULLARY (IM) NAIL INTERTROCHANTRIC (Left)  Patient Location: PACU  Anesthesia Type:General  Level of Consciousness: sedated, confused and responds to stimulation  Airway & Oxygen Therapy: Patient Spontanous Breathing and Patient connected to face mask oxygen  Post-op Assessment: Report given to RN and Post -op Vital signs reviewed and stable  Post vital signs: stable  Last Vitals:  Filed Vitals:   2015-05-05 0909  BP: 155/71  Pulse: 98  Temp: 36.6 C  Resp: 18    Complications: No apparent anesthesia complications

## 2015-04-10 NOTE — Progress Notes (Signed)
   04/08/2015 1042  Clinical Encounter Type  Visited With Patient and family together  Visit Type Follow-up  Referral From Nurse  Consult/Referral To Chaplain  Spiritual Encounters  Spiritual Needs Prayer;Sacred text  Patient and family requested prayer before surgery, as well as a bible.  Provided pastoral support and prayer to patient and family.  Surgery either today or tomorrow.  Patient wants another chaplain to follow up with him tomorrow.  [employer] Mack Thurmon-pager [phone]

## 2015-04-10 NOTE — Progress Notes (Signed)
Pt alert and oriented. Vitals stable- NSR with PVCs.  No complaints of pain- foley in place- Surgical incision dressing dry and intact.  Pt resting at this time.

## 2015-04-10 NOTE — Progress Notes (Signed)
Called report to ICU 13 nurse Charlie.  Belongings moved to ICU

## 2015-04-10 NOTE — Care Management (Signed)
RNCM consult received and will continue to follow. Patient pending repair to fractured hip. Patient appears to be from home with his wife. He usually uses a cane for ambulation per [facility] & P so he may need a rolling walker if he is able to return home at discharge. His PCP is Dr. Juel Burrow. He is currently on O2 but unsure if this is new or now. He has COPD and history of CHF. Thanks for the consult. Collie Siad [facility] [phone].

## 2015-04-10 NOTE — Consult Note (Signed)
ORTHOPAEDIC CONSULTATION  REQUESTING PHYSICIAN: Adrian Saran, MD  Chief Complaint:   Left hip pain.  History of Present Illness: Micheal George is a 81 y.o. male who lives at home with his wife. Apparently, he attempted to get out of bed to go to the bathroom at 3:00 this morning when he lost his balance and fell onto his left side. He was unable to get up so the emergency rescue team was called. They brought him to the emergency room where x-rays demonstrated a two-part intertrochanteric fracture of the left hip. The patient denies any associated injuries as a result of the fall, and denies any lightheadedness, chest pain, shortness breath, or other symptoms that may have precipitated his fall. He is being admitted to the medicine service due to his multiple medical problems.  Past Medical History  Diagnosis Date  . COPD (chronic obstructive pulmonary disease)   . Diabetes mellitus   . Neuropathy   . Thyroid disease     hypothyroidism  . Renal failure   . Unsteady gait    Past Surgical History  Procedure Laterality Date  . Hemorrhoid surgery     History   Social History  . Marital Status: Single    Spouse Name: N/A  . Number of Children: N/A  . Years of Education: N/A   Social History Main Topics  . Smoking status: Former Smoker    Types: Cigarettes, Pipe  . Smokeless tobacco: Not on file  . Alcohol Use: No  . Drug Use: No  . Sexual Activity: Not on file   Other Topics Concern  . Not on file   Social History Narrative  . No narrative on file   Family History  Problem Relation Age of Onset  . Heart disease Father    No Known Allergies Prior to Admission medications   Medication Sig Start Date End Date Taking? Authorizing Provider  aspirin EC 81 MG tablet Take 81 mg by mouth daily.   Yes Historical Provider, MD  clopidogrel (PLAVIX) 75 MG tablet Take 75 mg by mouth daily.   Yes Historical Provider, MD   furosemide (LASIX) 20 MG tablet Take 20 mg by mouth daily.   Yes Historical Provider, MD  gabapentin (NEURONTIN) 100 MG capsule Take 100 mg by mouth at bedtime.    Yes Historical Provider, MD  hydrALAZINE (APRESOLINE) 50 MG tablet Take 50 mg by mouth 2 (two) times daily.   Yes Historical Provider, MD  insulin detemir (LEVEMIR) 100 UNIT/ML injection Inject 20 Units into the skin at bedtime.   Yes Historical Provider, MD  levothyroxine (SYNTHROID, LEVOTHROID) 50 MCG tablet Take 50 mcg by mouth daily before breakfast.   Yes Historical Provider, MD  simvastatin (ZOCOR) 20 MG tablet Take 20 mg by mouth at bedtime.    Yes Historical Provider, MD   Dg Chest 1 View  03/27/2015   CLINICAL DATA:  Status post fall forward in hallway. Concern for chest injury. Initial encounter.  EXAM: CHEST  1 VIEW  COMPARISON:  Chest radiograph performed 12/23/2014  FINDINGS: Vascular congestion is noted, with mildly increased interstitial markings, possibly reflecting minimal interstitial edema. No pleural effusion or pneumothorax is seen.  The cardiomediastinal silhouette is borderline enlarged. No acute osseous abnormalities are identified.  IMPRESSION: Vascular congestion and borderline cardiomegaly, with mildly increased interstitial markings again noted. This may reflect minimal interstitial edema. No displaced rib fracture seen.   Electronically Signed   By: Roanna Raider M.D.   On: 03/23/2015 05:09  US Venous Img Lower Unilateral Left  2015/04/27   CLINICAL DATA:  Lower extremity swelling 3 weeks.  EXAM: Left LOWER EXTREMITY VENOUS DOPPLER ULTRASOUND  TECHNIQUE: Gray-scale sonography with graded compression, as well as color Doppler and duplex ultrasound were performed to evaluate the lower extremity deep venous systems from the level of the common femoral vein and including the common femoral, femoral, profunda femoral, popliteal and calf veins including the posterior tibial, peroneal and gastrocnemius veins when  visible. The superficial great saphenous vein was also interrogated. Spectral Doppler was utilized to evaluate flow at rest and with distal augmentation maneuvers in the common femoral, femoral and popliteal veins.  COMPARISON:  06/02/2014  FINDINGS: Contralateral Common Femoral Vein: Respiratory phasicity is normal and symmetric with the symptomatic side. No evidence of thrombus. Normal compressibility.  Common Femoral Vein: No evidence of thrombus. Normal compressibility, respiratory phasicity and response to augmentation.  Saphenofemoral Junction: No evidence of thrombus. Normal compressibility and flow on color Doppler imaging.  Profunda Femoral Vein: No evidence of thrombus. Normal compressibility and flow on color Doppler imaging.  Femoral Vein: No evidence of thrombus. Normal compressibility, respiratory phasicity and response to augmentation.  Popliteal Vein: No evidence of thrombus. Normal compressibility, respiratory phasicity and response to augmentation. Minimal linear focal echogenic region along the wall which may represent calcification or sequelae of old thrombus.  Calf Veins: No evidence of thrombus. Normal compressibility and flow on color Doppler imaging.  Superficial Great Saphenous Vein: No evidence of thrombus. Normal compressibility and flow on color Doppler imaging.  Venous Reflux:  None.  Other Findings:  None.  IMPRESSION: No evidence of deep venous thrombosis.   Electronically Signed   By: Elberta Fortis M.D.   On: 04/27/2015 07:48   Dg Hip Unilat With Pelvis 2-3 Views Left  April 27, 2015   CLINICAL DATA:  Status post fall forward in hallway. Left groin pain and difficulty moving left leg. Initial encounter.  EXAM: LEFT HIP (WITH PELVIS) 2-3 VIEWS  COMPARISON:  None.  FINDINGS: There is a displaced mildly comminuted intertrochanteric fracture involving the proximal left femur, with extension into the proximal femoral diaphysis. There is mild lateral displacement of the distal femur. The  right hip joint is grossly unremarkable. The left femoral head remains seated at the acetabulum.  The sacroiliac joints are grossly unremarkable. Scattered vascular calcifications are seen. The visualized bowel gas pattern is unremarkable.  IMPRESSION: Displaced mildly comminuted intertrochanteric fracture involving the proximal left femur, with extension into the proximal femoral diaphysis. Mild lateral displacement of the distal femur.   Electronically Signed   By: Roanna Raider M.D.   On: 2015/04/27 05:08   Positive ROS: All other systems have been reviewed and were otherwise negative with the exception of those mentioned in the HPI and as above.  Physical Exam: General:  Alert, no acute distress Psychiatric:  Patient is competent for consent with normal mood and affect   Cardiovascular:  No pedal edema Respiratory:  No wheezing, non-labored breathing GI:  Abdomen is soft and non-tender Skin:  No lesions in the area of chief complaint Neurologic:  Sensation intact distally Lymphatic:  No axillary or cervical lymphadenopathy  Orthopedic Exam:  Orthopedic examination is limited to the left hip and lower extremity. His left lower extremity somewhat shortened and external rotated as compared to the right. He has pain with any attempted active or passive motion of the hip. He also has some discomfort to firm palpation over the anterolateral aspect of the hip region. Skin  inspection is unremarkable. He has 1+ pedal edema but otherwise is neurovascularly intact to the left lower extremity and foot. He is able to actively dorsiflex and plantarflex his toes. He has intact sensation to light touch to all distributions, although sensation is subjectively diminished diffusely due to his history of peripheral neuropathy. He has good capillary refill to his left foot.  X-rays:  X-rays of the pelvis and left hip are available for review. These final films demonstrate a two-part intertrochanteric fracture of  the left hip.  Assessment: Two-part intertrochanteric fracture left hip.  Plan: Once the patient has been cleared medically, he will be taken to the operating room for definitive management of his injury. This will include a reduction and internal fixation of the intertrochanteric fracture with trochanteric femoral nail. This procedure has been discussed in detail with the patient and his family, as have the potential risks (including bleeding, infection, nerve and/or blood vessel injury, persistent or recurrent pain, stiffness, malunion and/or nonunion, need for further surgery, blood clots, strokes, heart attacks and/or arrhythmias, etc.) and benefits. The patient and his family their understanding and wish to proceed. A consent will be obtained by the nurse.    Maryagnes Amos, MD  Beeper #:  [phone]  04/08/2015 10:15 AM

## 2015-04-10 NOTE — H&P (Signed)
[facility] - [place] at [facility]   PATIENT NAME: Micheal George    MR#:  [id number]  DATE OF BIRTH:  05-Aug-1925  DATE OF ADMISSION:  04/23/15  PRIMARY CARE PHYSICIAN: Corky Downs, MD   REQUESTING/REFERRING PHYSICIAN: Dr. Dolores Frame  CHIEF COMPLAINT:  Fall with left hip pain  HISTORY OF PRESENT ILLNESS:  Micheal George  is a 81 y.o. male with a known history of COPD, DM and venrticular bigemeny who presents to the ED after  A fall early this morning. Patient reports that he has been in his usual state of health. Before he went to bed he took a pill of Lasix. 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 Or reaching for his cane he lost his balance and fell onto the floor on his left side. He called for his wife to help him. He was unable to get up so they called EMS for further evaluation. Patient was brought to the emergency department where he had an x-ray performed which showed a left femur fracture. Patient usually walks with a cane. He walks about half a block to one block without shortness of breath. Patient denies any recent chest pain or shortness of breath. Patient is able to perform all ADLs. Patient was also noted to have hypoxia. His chest x-ray shows some mild fluid. Patient reports that he does have a history of congestive heart failure and takes Lasix for lower external he swelling. He reports that his left lower extremity is always more swollen than right.  PAST MEDICAL HISTORY:   Past Medical History  Diagnosis Date  . COPD (chronic obstructive pulmonary disease)   . Diabetes mellitus   . Neuropathy   . Thyroid disease     hypothyroidism  . Renal failure   . Unsteady gait    Ventricular bigeminy PAST SURGICAL HISTORY:   Past Surgical History  Procedure Laterality Date  . Hemorrhoid surgery      SOCIAL HISTORY:   History  Substance Use Topics  . Smoking status: Former Smoker    Types: Cigarettes, Pipe  . Smokeless tobacco:  Not on file  . Alcohol Use: No    FAMILY HISTORY:   Family History  Problem Relation Age of Onset  . Heart disease Father     DRUG ALLERGIES:  No Known Allergies   REVIEW OF SYSTEMS:  CONSTITUTIONAL: No fever, fatigue or weakness.  EYES: No blurred or double vision.  EARS, NOSE, AND THROAT: No tinnitus or ear pain.  RESPIRATORY: No cough, shortness of breath, wheezing or hemoptysis.  CARDIOVASCULAR: No chest pain, orthopnea, he has some mild lower extremity edema left greater than right GASTROINTESTINAL: No nausea, vomiting, diarrhea or abdominal pain.  GENITOURINARY: No dysuria, hematuria.  ENDOCRINE: No polyuria, nocturia,  HEMATOLOGY: No anemia, easy bruising or bleeding SKIN: No rash or lesion. MUSCULOSKELETAL: He has pain on the left side from his fracture NEUROLOGIC: No tingling, numbness, weakness.  PSYCHIATRY: No anxiety or depression.   MEDICATIONS AT HOME:  Plavix 75 mg daily ASA 81 mg daily Synthroid  50 mg daily ZOCOR 20 mg daily Hydralazine 50 mg PO BID Levemir 20 units HS  Lasix 20 mg daily gabapentin 100 mg po HS  VITAL SIGNS:  Blood pressure 140/83, pulse 45, resp. rate 28, height 5\' 9"  (1.753 m), weight 72.576 kg (160 lb), SpO2 93 %.  PHYSICAL EXAMINATION:  GENERAL:  81 y.o.-year-old patient lying in the bed with mild  acute distress from fracture EYES: Pupils equal,  round, reactive to light and accommodation. No scleral icterus. Extraocular muscles intact.  HEENT: Head atraumatic, normocephalic. Oropharynx and nasopharynx clear.  NECK:  Supple, no jugular venous distention. No thyroid enlargement, no tenderness.  LUNGS: Normal breath sounds bilaterally, no wheezing, rales,rhonchi or crepitation. No use of accessory muscles of respiration.  CARDIOVASCULAR: S1, S2 normal. 3/6 SEM no rubs ABDOMEN: Soft, nontender, nondistended. Bowel sounds present. No organomegaly or mass.  EXTREMITIES: L.>R LEE NEUROLOGIC: Cranial nerves II through XII are grossly  intact. No focal deficits. PSYCHIATRIC: The patient is alert and oriented x 3.  SKIN: No obvious rash, lesion, or ulcer.   LABORATORY PANEL:   CBC  Recent Labs Lab 03/19/2015 0426  WBC 7.9  HGB 13.6  HCT 41.7  PLT 119*   ------------------------------------------------------------------------------------------------------------------  Chemistries   Recent Labs Lab 03/22/2015 0426  NA 142  K 5.0  CL 114*  CO2 22  GLUCOSE 110*  BUN 42*  CREATININE 1.79*  CALCIUM 9.1   ------------------------------------------------------------------------------------------------------------------  Cardiac Enzymes  Recent Labs Lab 04/02/2015 0426  TROPONINI <0.03   ------------------------------------------------------------------------------------------------------------------  RADIOLOGY:  Dg Chest 1 View  03/27/2015     IMPRESSION: Vascular congestion and borderline cardiomegaly, with mildly increased interstitial markings again noted. This may reflect minimal interstitial edema. No displaced rib fracture seen.   Electronically Signed   By: Roanna Raider M.D.   On: 03/29/2015 05:09   Dg Hip Unilat With Pelvis 2-3 Views Left    IMPRESSION: Displaced mildly comminuted intertrochanteric fracture involving the proximal left femur, with extension into the proximal femoral diaphysis. Mild lateral displacement of the distal femur.   Electronically Signed   By: Roanna Raider M.D.   On: 03/18/2015 05:08    EKG:  Sinus rhythm with PVCs  IMPRESSION AND PLAN:  This is a 81 year old male with a history of ventricular bigeminy, COPD and congestive heart failure who presents after mechanical fall and unfortunately has suffered a left hip fracture.  1. Preop for left hip fracture: Patient has some mild congestive heart failure on chest x-ray as well as hypoxia. Patient has some minimal lower extremity edema and also a heart murmur. I suggest we obtain a echocardiogram prior to procedure. I also  suggest that we obtain a cardiology consultation which I have placed. Patient usually sees Dr. Harl Bowie, however he has seen [facility] in the past.  Patient may proceed to surgery if cleared by cardiology.  2. Acute congestive heart failure: It is unclear to me if the patient is systolic or diastolic heart failure. I will order an echocardiogram due to the murmur and to identify the type of congestive heart failure patient has. I will give 1 dose of Lasix. I will follow chest x-ray in the a.m.  3. History of ventricular bigeminy: Patient does have PVCs on EKG. We will need to make sure potassium and magnesium are adequate prior to procedure. Further recommendations as per Cardiology.  4. COPD: Patient does not appear to be in acute exacerbation. He does not have wheezing. We will continue his inhalers.  5. Diabetes: Patient will be on a sliding scale insulin for now. He may also need low-dose Lasix I will. I will follow his blood sugars.  6.Thrombocytopenia: Patient's platelets are 119 this morning. We will need to monitor them if he is going to be on DVT prophylaxis.  7. Bradycardia: It is noted the patient's heart rates did decrease. He was asymptomatic. Patient will be placed on telemetry.  8. Acute on chronic kidney  disease stage III: Patient's creatinine will be followed while in the hospital. I will not place patient fluids at this time due to his history of congestive heart failure. I will repeat BMP in a.m. Hold nephrotoxic agents.   All the records are reviewed and case discussed with ED provider. Management plans discussed with the patient and he is in agreement.  CODE STATUS: FULL  TOTAL TIME TAKING CARE OF THIS PATIENT: 50 minutes.    Bracy Pepper M.D on 04/09/2015 at 6:45 AM  Between 7am to 6pm - Pager - [phone] After 6pm go to [email or web address] - password [email or web address] [facility]  [facility]  Office  [phone]  CC: Primary care physician; Corky Downs,  MD

## 2015-04-11 ENCOUNTER — Inpatient Hospital Stay: Payer: Medicare Other

## 2015-04-11 ENCOUNTER — Encounter: Payer: Self-pay | Admitting: Surgery

## 2015-04-11 DIAGNOSIS — I5032 Chronic diastolic (congestive) heart failure: Secondary | ICD-10-CM

## 2015-04-11 DIAGNOSIS — W19XXXA Unspecified fall, initial encounter: Secondary | ICD-10-CM | POA: Insufficient documentation

## 2015-04-11 DIAGNOSIS — J449 Chronic obstructive pulmonary disease, unspecified: Secondary | ICD-10-CM

## 2015-04-11 DIAGNOSIS — Y92009 Unspecified place in unspecified non-institutional (private) residence as the place of occurrence of the external cause: Secondary | ICD-10-CM

## 2015-04-11 DIAGNOSIS — R0902 Hypoxemia: Secondary | ICD-10-CM | POA: Insufficient documentation

## 2015-04-11 DIAGNOSIS — S72002A Fracture of unspecified part of neck of left femur, initial encounter for closed fracture: Secondary | ICD-10-CM | POA: Insufficient documentation

## 2015-04-11 LAB — GLUCOSE, CAPILLARY
GLUCOSE-CAPILLARY: 342 mg/dL — AB (ref 65–99)
GLUCOSE-CAPILLARY: 402 mg/dL — AB (ref 65–99)
Glucose-Capillary: 330 mg/dL — ABNORMAL HIGH (ref 65–99)
Glucose-Capillary: 291 mg/dL — ABNORMAL HIGH (ref 65–99)

## 2015-04-11 LAB — BASIC METABOLIC PANEL
Anion gap: 9 (ref 5–15)
BUN: 46 mg/dL — AB (ref 6–20)
CO2: 18 mmol/L — ABNORMAL LOW (ref 22–32)
Calcium: 8.5 mg/dL — ABNORMAL LOW (ref 8.9–10.3)
Chloride: 114 mmol/L — ABNORMAL HIGH (ref 101–111)
Creatinine, Ser: 2.22 mg/dL — ABNORMAL HIGH (ref 0.61–1.24)
GFR, EST AFRICAN AMERICAN: 29 mL/min — AB (ref >60)
GFR, EST NON AFRICAN AMERICAN: 25 mL/min — AB (ref >60)
Glucose, Bld: 367 mg/dL — ABNORMAL HIGH (ref 65–99)
Potassium: 5.4 mmol/L — ABNORMAL HIGH (ref 3.5–5.1)
Sodium: 141 mmol/L (ref 135–145)

## 2015-04-11 LAB — BLOOD GAS, ARTERIAL
ALLENS TEST (PASS/FAIL): POSITIVE — AB
Acid-base deficit: 8.3 mmol/L — ABNORMAL HIGH (ref 0.0–2.0)
Bicarbonate: 16.2 mEq/L — ABNORMAL LOW (ref 21.0–28.0)
FIO2: 0.4 %
O2 SAT: 82.4 %
PH ART: 7.34 — AB (ref 7.350–7.450)
Patient temperature: 37
pCO2 arterial: 30 mmHg — ABNORMAL LOW (ref 32.0–48.0)
pO2, Arterial: 50 mmHg — ABNORMAL LOW (ref 83.0–108.0)

## 2015-04-11 LAB — CBC
HCT: 33.9 % — ABNORMAL LOW (ref 40.0–52.0)
Hemoglobin: 11.1 g/dL — ABNORMAL LOW (ref 13.0–18.0)
MCH: 30.1 pg (ref 26.0–34.0)
MCHC: 32.7 g/dL (ref 32.0–36.0)
MCV: 92.3 fL (ref 80.0–100.0)
PLATELETS: 101 10*3/uL — AB (ref 150–440)
RBC: 3.67 MIL/uL — AB (ref 4.40–5.90)
RDW: 16.6 % — ABNORMAL HIGH (ref 11.5–14.5)
WBC: 10.3 10*3/uL (ref 3.8–10.6)

## 2015-04-11 LAB — POTASSIUM: POTASSIUM: 5.4 mmol/L — AB (ref 3.5–5.1)

## 2015-04-11 MED ORDER — INSULIN DETEMIR 100 UNIT/ML ~~LOC~~ SOLN
20.0000 [IU] | Freq: Every day | SUBCUTANEOUS | Status: DC
  Administered 2015-04-11: 20 [IU] via SUBCUTANEOUS
  Filled 2015-04-11 (×2): qty 0.2

## 2015-04-11 MED ORDER — INSULIN ASPART 100 UNIT/ML IV SOLN
10.0000 [IU] | Freq: Once | INTRAVENOUS | Status: AC
  Administered 2015-04-11: 10 [IU] via INTRAVENOUS
  Filled 2015-04-11: qty 0.1

## 2015-04-11 MED ORDER — INSULIN ASPART 100 UNIT/ML ~~LOC~~ SOLN: 0.0000 [IU] | Freq: Three times a day (TID) | SUBCUTANEOUS | Status: DC

## 2015-04-11 MED ORDER — INSULIN ASPART 100 UNIT/ML ~~LOC~~ SOLN
0.0000 [IU] | Freq: Every day | SUBCUTANEOUS | Status: DC
  Administered 2015-04-11: 5 [IU] via SUBCUTANEOUS
  Administered 2015-04-13: 4 [IU] via SUBCUTANEOUS
  Filled 2015-04-11: qty 4

## 2015-04-11 MED ORDER — DEXTROSE 50 % IV SOLN
1.0000 | Freq: Once | INTRAVENOUS | Status: AC
  Administered 2015-04-11: 50 mL via INTRAVENOUS
  Filled 2015-04-11: qty 50

## 2015-04-11 MED ORDER — FUROSEMIDE 10 MG/ML IJ SOLN
40.0000 mg | Freq: Once | INTRAMUSCULAR | Status: AC
  Administered 2015-04-11: 40 mg via INTRAVENOUS
  Filled 2015-04-11: qty 4

## 2015-04-11 MED ORDER — METHYLPREDNISOLONE SODIUM SUCC 40 MG IJ SOLR
40.0000 mg | Freq: Three times a day (TID) | INTRAMUSCULAR | Status: DC
  Administered 2015-04-11 – 2015-04-12 (×3): 40 mg via INTRAVENOUS
  Filled 2015-04-11 (×3): qty 1

## 2015-04-11 MED ORDER — SODIUM CHLORIDE 0.9 % IV SOLN
INTRAVENOUS | Status: DC
  Administered 2015-04-11: 08:00:00 via INTRAVENOUS

## 2015-04-11 MED ORDER — ENOXAPARIN SODIUM 30 MG/0.3ML ~~LOC~~ SOLN
30.0000 mg | SUBCUTANEOUS | Status: DC
  Administered 2015-04-11 – 2015-04-12 (×2): 30 mg via SUBCUTANEOUS
  Filled 2015-04-11 (×2): qty 0.3

## 2015-04-11 MED ORDER — SODIUM POLYSTYRENE SULFONATE 15 GM/60ML PO SUSP
30.0000 g | Freq: Once | ORAL | Status: AC
  Administered 2015-04-11: 30 g via ORAL
  Filled 2015-04-11: qty 120

## 2015-04-11 NOTE — Progress Notes (Signed)
  Subjective: 1 Day Post-Op Procedure(s) (LRB): INTRAMEDULLARY (IM) NAIL INTERTROCHANTRIC (Left) Patient reports pain as 4 on 0-10 scale.  The pain is better this morning than it was last night.  Patient is well, but has had some minor complaints of pain. Plan is to go Skilled nursing facility after hospital stay. Negative for chest pain and shortness of breath Fever: no Gastrointestinal:Negative for nausea and vomiting  Objective: Vital signs in last 24 hours: Temp:  [97.4 F (36.3 C)-98.6 F (37 C)] 98 F (36.7 C) (06/27 0700) Pulse Rate:  [25-116] 66 (06/27 0743) Resp:  [18-31] 21 (06/27 0743) BP: (91-155)/(44-112) 123/75 mmHg (06/27 0700) SpO2:  [84 %-97 %] 97 % (06/27 0743) Arterial Line BP: (117-158)/(38-71) 129/49 mmHg (06/27 0743) FiO2 (%):  [70 %] 70 % (06/27 0743)  Intake/Output from previous day:  Intake/Output Summary (Last 24 hours) at 04/11/15 0805 Last data filed at 04/11/15 0529  Gross per 24 hour  Intake   2400 ml  Output   2280 ml  Net    120 ml    Intake/Output this shift:    Labs:  Recent Labs  03/29/2015 0426 04/11/15 0528  HGB 13.6 11.1*    Recent Labs  04/03/2015 0426 04/11/15 0528  WBC 7.9 10.3  RBC 4.52 3.67*  HCT 41.7 33.9*  PLT 119* 101*    Recent Labs  04/09/2015 0426 04/11/15 0528  NA 142 141  K 5.0 5.4*  CL 114* 114*  CO2 22 18*  BUN 42* 46*  CREATININE 1.79* 2.22*  GLUCOSE 110* 367*  CALCIUM 9.1 8.5*    Recent Labs   0600  INR 1.27     EXAM General - Patient is Alert, Confused and hard of hearing. Extremity - ABD soft Neurovascular intact Sensation intact distally Intact pulses distally Dorsiflexion/Plantar flexion intact Incision: scant drainage Dressing/Incision - blood tinged drainage Motor Function - intact, moving foot and toes well on exam.  Past Medical History  Diagnosis Date  . COPD (chronic obstructive pulmonary disease)   . Diabetes mellitus   . Neuropathy   . Thyroid disease    hypothyroidism  . Renal failure   . Unsteady gait   . HTN (hypertension)   . Hyperlipidemia   . Dysrhythmia   . Hypothyroidism   . Aortic stenosis   . LVH (left ventricular hypertrophy)     Assessment/Plan: 1 Day Post-Op Procedure(s) (LRB): INTRAMEDULLARY (IM) NAIL INTERTROCHANTRIC (Left) Active Problems:   Hip fracture  Estimated body mass index is 23.62 kg/(m^2) as calculated from the following:   Height as of this encounter: 5\' 9"  (1.753 m).   Weight as of this encounter: 72.576 kg (160 lb). Advance diet  Will start with PT today. Social work to help with discharge planning. Foley was d/c last night due to pt tugging and trying to remove Foley himself. K was 5.4 this morning.  I have d/c IV with20 KCl and started him on 0.9% NACl at 24ml/hr Will check BMP later today. Will order CBC for tomorrow morning. He will need to have a BM today.  DVT Prophylaxis - Lovenox, Foot Pumps, TED hose and Plavix Weight-Bearing as tolerated to left leg  J. Horris Latino, PA-C [facility] 04/11/2015, 8:05 AM

## 2015-04-11 NOTE — Anesthesia Postprocedure Evaluation (Signed)
  Anesthesia Post-op Note  Patient: Micheal George  Procedure(s) Performed: Procedure(s): INTRAMEDULLARY (IM) NAIL INTERTROCHANTRIC (Left)  Anesthesia type:General  Patient location: ICU13  Post pain: Pain level controlled  Post assessment: Post-op Vital signs reviewed, Patient's Cardiovascular Status Stable, Respiratory Function Stable, Patent Airway and No signs of Nausea or vomiting  Post vital signs: Reviewed and stable  Last Vitals:  Filed Vitals:   04/11/15 0600  BP: 129/44  Pulse: 86  Temp:   Resp: 20    Level of consciousness: awake, alert  and patient cooperative  Complications: No apparent anesthesia complications

## 2015-04-11 NOTE — Clinical Social Work Placement (Signed)
   CLINICAL SOCIAL WORK PLACEMENT  NOTE  Date:  04/11/2015  Patient Details  Name: Charlsie QuestMelvin H Rolfe MRN: [id number] Date of Birth: 01/15/1925  Clinical Social Work is seeking post-discharge placement for this patient at the Skilled  Nursing Facility level of care (*CSW will initial, date and re-position this form in  chart as items are completed):  Yes   Patient/family provided with [facility] Clinical Social Work Department's list of facilities offering this level of care within the geographic area requested by the patient (or if unable, by the patient's family).  Yes   Patient/family informed of their freedom to choose among providers that offer the needed level of care, that participate in Medicare, Medicaid or managed care program needed by the patient, have an available bed and are willing to accept the patient.  Yes   Patient/family informed of [facility]'s ownership interest [facility] and [facility], as well as of the fact that they are under no obligation to receive care at these facilities.  PASRR submitted to EDS on       PASRR number received on       Existing PASRR number confirmed on 04/11/15     FL2 transmitted to all facilities in geographic area requested by pt/family on 04/11/15     FL2 transmitted to all facilities within larger geographic area on       Patient informed that his/her managed care company has contracts with or will negotiate with certain facilities, including the following:            Patient/family informed of bed offers received.  Patient chooses bed at       Physician recommends and patient chooses bed at      Patient to be transferred to   on  .  Patient to be transferred to facility by       Patient family notified on   of transfer.  Name of family member notified:        PHYSICIAN Please sign FL2     Additional Comment:    _______________________________________________ Haig ProphetMorgan, Mickeal Daws G, LCSW 04/11/2015, 3:56  PM

## 2015-04-11 NOTE — Progress Notes (Signed)
Called dr Sheryle Hail about problems pt having with foley in place pt has been pull at it says has to pee checked bladder scan only 6cc question having bladder spasms  Dr Sheryle Hail said may d/c foley Shara Blazing, RN

## 2015-04-11 NOTE — Progress Notes (Signed)
Initial Nutrition Assessment    INTERVENTION:   Medical Food Supplement: will add Honey Thick MightyShakes TID with meals, Magic Cup and Lunch and Dinner Meals/Snacks: cater to pt preferences  NUTRITION DIAGNOSIS:  Inadequate oral intake related to acute illness as evidenced by per patient/family report.  GOAL:  Patient will meet greater than or equal to 90% of their needs   MONITOR:   (Energy Intake, Anthropometrics, Electrolyte/Renal Profile, Digestive System)  REASON FOR ASSESSMENT:  Malnutrition Screening Tool    ASSESSMENT:  Pt admitted with hip fracture, acute CHF, acute on CKD, currently on HFNC  PMHx:  Past Medical History  Diagnosis Date  . COPD (chronic obstructive pulmonary disease)   . Diabetes mellitus   . Neuropathy   . Thyroid disease     hypothyroidism  . Renal failure   . Unsteady gait   . HTN (hypertension)   . Hyperlipidemia   . Dysrhythmia   . Hypothyroidism   . Aortic stenosis   . LVH (left ventricular hypertrophy)     Diet Order: Dysphagia I, Honey Thick; SLP eval  Current Nutrition: per pt report, ate some lunch today. Appetite fair  Food/Nutrition-Related History: pt reports poor appetite but unable to perform 24 hour recall  Medications: lasix, reglan, levemir, novolog, solumedrol, senokot, kayexalate  Electrolyte/Renal Profile and Glucose Profile:   Recent Labs Lab  0426 04/11/15 0528 04/11/15 1253  NA 142 141  --   K 5.0 5.4* 5.4*  CL 114* 114*  --   CO2 22 18*  --   BUN 42* 46*  --   CREATININE 1.79* 2.22*  --   CALCIUM 9.1 8.5*  --   GLUCOSE 110* 367*  --     Nutrition-Focused Physical Exam Findings: Nutrition-Focused physical exam completed. Findings are wdl fat depletion, mild/moderate muscle depletion, and mild edema.   Weight Change: Pt reports 20-25 pound wt loss, unable to verify the time frame. 11%-13.5% wt loss  Height:  Ht Readings from Last 1 Encounters:  04/03/2015 5\' 9"  (1.753 m)     Weight:  Wt Readings from Last 1 Encounters:  04/11/2015 160 lb (72.576 kg)   Filed Weights   03/21/2015 0414  Weight: 160 lb (72.576 kg)      Wt Readings from Last 10 Encounters:  04/03/2015 160 lb (72.576 kg)  07/22/12 188 lb (85.276 kg)  03/18/12 190 lb (86.183 kg)    BMI:  Body mass index is 23.62 kg/(m^2).  Estimated Nutritional Needs:  Kcal:  1831-2164 kcals (BEE 1387, 1.2 AF, 1.1-1.3 IF)   Protein:  80-95 g (1.1-13. g/kg)   Fluid:  1825-2190 mL (25-30 ml/kg)   Skin:  Reviewed, no issues  Diet Order:  DIET - DYS 1 Room service appropriate?: Yes; Fluid consistency:: Honey Thick     Intake/Output Summary (Last 24 hours) at 04/11/15 1503 Last data filed at 04/11/15 1100  Gross per 24 hour  Intake 1694.58 ml  Output   1155 ml  Net 539.58 ml   MODERATE Care Level  Romelle Starcherate Gerry Heaphy MS, RD, LDN [phone] Pager

## 2015-04-11 NOTE — Evaluation (Signed)
Physical Therapy Evaluation Patient Details Name: Micheal George MRN: [id number] DOB: 11/04/1924 Today's Date: 04/11/2015   History of Present Illness  Pt is an 81 y.o. male s/p fall sustaining a 2 part intertrochanteric fracture.  Pt s/p ORIF L hip 04/09/2015.  Pt also with mild CHF, hypoxia, and heart murmur.  Pt currently has R arterial femoral line and on high flow nasal cannula.  Clinical Impression  Currently pt demonstrates impairments with strength, activity tolerance, pain, and anticipate pt will have limitations with functional mobility s/p L hip ORIF (mobility deferred today d/t R arterial femoral line).  Prior to admission, pt was independent ambulating with SPC but pt reports multiple falls in the past 6 months.  Pt lives with his wife in a 1 level home with ramp to enter.  Currently pt reports fatigue with LE bed ex's.  Pt would benefit from skilled PT to address above noted impairments and anticipated functional limitations.  Recommend pt discharge to STR when medically appropriate.     Follow Up Recommendations SNF    Equipment Recommendations  Rolling walker with 5" wheels    Recommendations for Other Services       Precautions / Restrictions Precautions Precautions: Fall Precaution Comments: R arterial femoral line Restrictions Weight Bearing Restrictions: Yes LLE Weight Bearing: Weight bearing as tolerated      Mobility  Bed Mobility               General bed mobility comments: Mobility deferred d/t R arterial femoral line.  Transfers                    Ambulation/Gait                Stairs            Wheelchair Mobility    Modified Rankin (Stroke Patients Only)       Balance                                             Pertinent Vitals/Pain Pain Assessment: 0-10 Pain Score: 2  Pain Location: L hip Pain Descriptors / Indicators: Tender;Sore;Aching Pain Intervention(s): Limited activity within  patient's tolerance;Monitored during session;Repositioned  BP 106/58-130/45 during session.  HR 80-88 bpm during session.  O2 >94% on HFNC.    Home Living Family/patient expects to be discharged to:: Skilled nursing facility Living Arrangements: Spouse/significant other   Type of Home: House Home Access: Ramped entrance     Home Layout: One level Home Equipment: Cane - single point;Shower seat      Prior Function Level of Independence: Independent with assistive device(s)   Gait / Transfers Assistance Needed: Uses SPC           Hand Dominance        Extremity/Trunk Assessment   Upper Extremity Assessment: Overall WFL for tasks assessed           Lower Extremity Assessment: RLE deficits/detail;LLE deficits/detail         Communication   Communication: HOH  Cognition Arousal/Alertness: Awake/alert Behavior During Therapy: WFL for tasks assessed/performed Overall Cognitive Status: Impaired/Different from baseline Area of Impairment: Orientation Orientation Level: Disoriented to;Time (Pt reported it was July 22nd 1900's initially but then able to state 2016)   Memory: Decreased recall of precautions  General Comments  Nursing cleared pt for participation in physical therapy.  Pt agreeable to PT session.  Pt's daughter present during session.    Exercises  Treatment:  Performed semi-supine LE therapeutic exercise x 10 reps:  Ankle pumps (AROM B LE's); quad sets x3 second holds (AROM B LE's); glute squeezes x3 second holds (AROM B); SAQ's (AAROM L); heelslides (AAROM L), hip abd/adduction (AAROM L).  Pt required vc's and tactile cues for correct technique with exercises.  Only R LE isometrics performed d/t R LE arterial femoral line.       Assessment/Plan    PT Assessment Patient needs continued PT services  PT Diagnosis Acute pain;Difficulty walking   PT Problem List Decreased strength;Decreased activity tolerance;Decreased  mobility;Decreased knowledge of use of DME;Decreased knowledge of precautions;Pain  PT Treatment Interventions DME instruction;Gait training;Functional mobility training;Therapeutic activities;Therapeutic exercise;Balance training;Patient/family education   PT Goals (Current goals can be found in the Care Plan section) Acute Rehab PT Goals Patient Stated Goal: To get better PT Goal Formulation: With patient Time For Goal Achievement: 04/25/15 Potential to Achieve Goals: Good    Frequency BID   Barriers to discharge Decreased caregiver support      Co-evaluation               End of Session Equipment Utilized During Treatment: Oxygen (HFNC) Activity Tolerance: Patient limited by fatigue Patient left: in bed;with call bell/phone within reach;with bed alarm set;with family/visitor present           Time: 8119-1478 PT Time Calculation (min) (ACUTE ONLY): 19 min   Charges:   PT Evaluation $Initial PT Evaluation Tier I: 1 Procedure PT Treatments $Therapeutic Exercise: 8-22 mins   PT G CodesHendricks Limes 2015-04-21, 1:38 PM  Hendricks Limes, PT [phone]

## 2015-04-11 NOTE — Progress Notes (Signed)
[facility] [place] at [facility]   PATIENT NAME: Micheal George    MR#:  [id number]  DATE OF BIRTH:  08/23/1925  SUBJECTIVE:  Patient says he has left hip pain. He denies chest pain he has some minimal shortness of breath.  REVIEW OF SYSTEMS:    Review of Systems  Constitutional: Negative for fever, chills and malaise/fatigue.  HENT: Negative for sore throat.   Eyes: Negative for blurred vision.  Respiratory: Positive for shortness of breath. Negative for cough, hemoptysis, sputum production and wheezing.   Cardiovascular: Positive for leg swelling. Negative for chest pain and palpitations.  Gastrointestinal: Negative for nausea, vomiting, abdominal pain, diarrhea, blood in stool and melena.  Genitourinary: Negative for dysuria.  Musculoskeletal: Negative for back pain.       Hip pain  Neurological: Negative for dizziness, tremors and headaches.  Endo/Heme/Allergies: Does not bruise/bleed easily.    Tolerating Diet: Nothing by mouth      DRUG ALLERGIES:  No Known Allergies  VITALS:  Blood pressure 114/92, pulse 88, temperature 98 F (36.7 C), temperature source Oral, resp. rate 19, height 5\' 9"  (1.753 m), weight 72.576 kg (160 lb), SpO2 97 %.  PHYSICAL EXAMINATION:   Physical Exam  Constitutional: He is oriented to person, place, and time and well-developed, well-nourished, and in no distress. No distress.  HENT:  Head: Normocephalic.  Eyes: No scleral icterus.  Neck: Normal range of motion. Neck supple. No JVD present. No tracheal deviation present.  Cardiovascular: Normal rate and regular rhythm.  Exam reveals no gallop and no friction rub.   Murmur heard. Pulmonary/Chest: Effort normal. No respiratory distress. He has no wheezes. He has no rales. He exhibits no tenderness.  Crackles at bases  Abdominal: Soft. Bowel sounds are normal. He exhibits no distension and no mass. There is no tenderness. There is no rebound and no guarding.   Musculoskeletal: Normal range of motion. He exhibits no edema.  Neurological: He is alert and oriented to person, place, and time.  Skin: Skin is warm. No rash noted. No erythema.  Psychiatric: Affect and judgment normal.      LABORATORY PANEL:   CBC  Recent Labs Lab 04/11/15 0528  WBC 10.3  HGB 11.1*  HCT 33.9*  PLT 101*   ------------------------------------------------------------------------------------------------------------------  Chemistries   Recent Labs Lab 04/11/15 0528  NA 141  K 5.4*  CL 114*  CO2 18*  GLUCOSE 367*  BUN 46*  CREATININE 2.22*  CALCIUM 8.5*   ------------------------------------------------------------------------------------------------------------------  Cardiac Enzymes  Recent Labs Lab 04/09/2015 0426  TROPONINI <0.03   ------------------------------------------------------------------------------------------------------------------  RADIOLOGY:  CXR: Low-grade pulmonary interstitial edema   ASSESSMENT AND PLAN:  81 yo male with history of DM, HLD, HTN without documented CAD with history of PVCs/Bigeminy, COPD and renal insufficiency on chronic Plavix who is admitted after a fall and found to have a left hip fracture s/p intertrochanteric repair 6/26.  1. Acute diastolic heart failure: 2-D echocardiogram showed EF of 50% with mild diastolic dysfunction. Patient has lower extremity edema as well as chest x-ray showing pulmonary edema. Patient's creatinine did increase this morning. I appreciate cardiology consultation. As per cardiology recommendations, hold Lasix for today.  2. Acute on chronic kidney disease stage III: Likely due to ATN in the setting of hypotension and hypoxia postop. We are holding Lasix for today and repeat BMP in a.m. Avoid nephrotoxic agents.  3. Aortic stenosis: His echo this time shows a thickened, calcified aortic valve with moderate to severe stenosis.  Patient will need to have close follow-up with  cardiology.   4. COPD: I will continue IV steroids and inhalers.  5. Acute respiratory failure: After his procedure patient had increased O2 requirements. He is transferred to stepdown unit on high flow nasal cannula. We are trying to wean the high flow nasal cannula. I will continue IV steroids and Lasix when okay with cardiology.  6. Diabetes: Patient will continue with sliding scale insulin.  7. Thrombocytopenia: Patient's platelets are 101 this morning I will continue to follow. He is on low-dose Lovenox.  8. Hypothyroidism: Continue Synthroid  9. Nutrition: I will obtain a speech consultation as the family was stating at the patient was having difficulty swelling. If after's each consultation he is able to swallow appropriately we will place him on an appropriate diet.       Management plans discussed with the patient and he is in agreement.  CODE STATUS: Full  TOTAL TIME TAKING CARE OF THIS PATIENT: 35 minutes.   Greater than 50% counseling and coordination of care  POSSIBLE D/C , DEPENDING ON CLINICAL CONDITION.   Katelyn Broadnax M.D on 04/11/2015 at 10:54 AM  Between 7am to 6pm - Pager - [phone] After 6pm go to [email or web address] - password [email or web address] [facility]  [facility]  Office  [phone]  CC: Primary care physician; Corky Downs, MD

## 2015-04-11 NOTE — Progress Notes (Signed)
Alert. Confused to time of day. A little forgetful at times.  Denies pain or need for pain med x with turning. He refuses pain med for turning.  Unable to wean from high flow nasal cannula. Weak cough effort. Lung fields diminished bibasilar. K+ elevated had lasix with poor result. Urine dk amber cloudy. Insulin and D50 was given. Kayexalate taken and without stool so far.  Seen by speech therapy for swallow eval.  Seen by PT for bed exercises.  Will DC aline.  See epic charting for more info

## 2015-04-11 NOTE — Progress Notes (Addendum)
Patient: Micheal George / Admit Date:  / Date of Encounter: 04/11/2015, 8:28 AM   Subjective: Did not sleep well last night 2/2 noise. Repositioning led to increased pain of the left hip. Requiring HFNC mostly and nebs. Remaining hypoxic in the low to mid 90's. On room air he has dropped back into the 70's. Review of echo appears to actually show mild to moderate aortic stenosis when reviewing echo films and all parameters which is not much change from prior 3 years ago. SCr slightly bumped from this AM to 2.22 from 1.79 (which is around his baseline). CXR showed low grade pulmonary edema. Has been on 75 cc/hr.      Review of Systems: Review of Systems  Constitutional: Positive for malaise/fatigue. Negative for fever, chills, weight loss and diaphoresis.  HENT: Negative for congestion.   Eyes: Negative for blurred vision, discharge and redness.  Respiratory: Positive for shortness of breath. Negative for cough, hemoptysis, sputum production and wheezing.        Requiring aerosol mask    Cardiovascular: Positive for leg swelling. Negative for chest pain, palpitations, orthopnea, claudication and PND.  Gastrointestinal: Negative for heartburn, nausea and vomiting.  Musculoskeletal: Positive for joint pain. Negative for falls.       Left hip pain with movement   Skin: Negative for rash.  Neurological: Positive for weakness. Negative for dizziness and headaches.  Endo/Heme/Allergies: Does not bruise/bleed easily.  Psychiatric/Behavioral: The patient is not nervous/anxious.   All other systems reviewed and are negative.   Objective: Telemetry: NSR, frequent PVC's and ventricular bigeminy  Physical Exam: Blood pressure 123/75, pulse 66, temperature 98 F (36.7 C), temperature source Oral, resp. rate 21, height 5\' 9"  (1.753 m), weight 160 lb (72.576 kg), SpO2 97 %. Body mass index is 23.62 kg/(m^2). General: Well developed, well nourished, in no acute distress. Head:  Normocephalic, atraumatic, sclera non-icteric, no xanthomas, nares are without discharge. Neck: Negative for carotid bruits. JVP not elevated. Lungs: Diffuse bilateral crackles. Decreased breath sounds. No wheezes or rhonchi. Breathing is unlabored. Heart: RRR S1 S2. III/VI systolic murmur RUSB and apex, I/VI diastolic murmur LUSB. No rubs or gallops.  Abdomen: Soft, non-tender, non-distended with normoactive bowel sounds. No rebound/guarding. Extremities: No clubbing or cyanosis. 1+ pitting edema to bilateral mid calves. Distal pedal pulses are 2+ and equal bilaterally. Neuro: Alert and oriented X 3. Moves all extremities spontaneously. Psych:  Responds to questions appropriately with a normal affect.   Intake/Output Summary (Last 24 hours) at 04/11/15 0828 Last data filed at 04/11/15 0529  Gross per 24 hour  Intake   2400 ml  Output   2280 ml  Net    120 ml    Inpatient Medications:  . aspirin EC  81 mg Oral Daily  . clopidogrel  75 mg Oral Daily  . docusate sodium  100 mg Oral BID  . enoxaparin (LOVENOX) injection  30 mg Subcutaneous Q24H  . gabapentin  100 mg Oral QHS  . hydrALAZINE  50 mg Oral BID  . insulin aspart  0-5 Units Subcutaneous QHS  . insulin aspart  0-9 Units Subcutaneous TID WC  . ipratropium-albuterol  3 mL Nebulization Q6H  . levothyroxine  50 mcg Oral QAC breakfast  . methylPREDNISolone (SOLU-MEDROL) injection  60 mg Intravenous Q8H  . mometasone-formoterol  2 puff Inhalation BID  . pantoprazole  40 mg Oral Daily  . simvastatin  20 mg Oral Daily  . sodium chloride  3 mL Intravenous Q12H  Infusions:  . sodium chloride 75 mL/hr at 04/11/15 0806    Labs:  Recent Labs  12/27/14 0426 04/11/15 0528  NA 142 141  K 5.0 5.4*  CL 114* 114*  CO2 22 18*  GLUCOSE 110* 367*  BUN 42* 46*  CREATININE 1.79* 2.22*  CALCIUM 9.1 8.5*   No results for input(s): AST, ALT, ALKPHOS, BILITOT, PROT, ALBUMIN in the last 72 hours.  Recent Labs  12/27/14 0426  04/11/15 0528  WBC 7.9 10.3  NEUTROABS 5.0  --   HGB 13.6 11.1*  HCT 41.7 33.9*  MCV 92.1 92.3  PLT 119* 101*    Recent Labs  12/27/14 0426  TROPONINI <0.03   Invalid input(s): POCBNP No results for input(s): HGBA1C in the last 72 hours.   Weights: Filed Weights   12/27/14 0414  Weight: 160 lb (72.576 kg)     Radiology/Studies:  Dg Chest 1 View  04/11/2015   CLINICAL DATA:  COPD and CHF, hypoxia postop hip fracture  EXAM: CHEST  1 VIEW  COMPARISON:  Portable chest x-ray of April 10, 2015  FINDINGS: The lungs are adequately inflated. The interstitial markings are slightly more conspicuous bilaterally today. The cardiac silhouette is enlarged. The central pulmonary vascularity is slightly more congested. The mediastinum is normal in width. The bony thorax exhibits no acute abnormality.  IMPRESSION: Interval development of low-grade pulmonary interstitial edema likely secondary to CHF.   Electronically Signed   By: David  Swazil[place]Jordan M.D.   On: 04/11/2015 07:32   Dg Chest 1 View  09-26-2015   CLINICAL DATA:  Postop hip fracture today now with hypoxia  EXAM: CHEST  1 VIEW  COMPARISON:  012-09-2015  FINDINGS: Mild cardiac enlargement stable. Central pulmonary arteries prominent but stable. Vascular pattern normal. No consolidation or effusion.  IMPRESSION: No acute findings   Electronically Signed   By: Esperanza Heiraymond  Rubner M.D.   On: 012-09-2015 15:22   Dg Chest 1 View  09-26-2015   CLINICAL DATA:  Status post fall forward in hallway. Concern for chest injury. Initial encounter.  EXAM: CHEST  1 VIEW  COMPARISON:  Chest radiograph performed 12/23/2014  FINDINGS: Vascular congestion is noted, with mildly increased interstitial markings, possibly reflecting minimal interstitial edema. No pleural effusion or pneumothorax is seen.  The cardiomediastinal silhouette is borderline enlarged. No acute osseous abnormalities are identified.  IMPRESSION: Vascular congestion and borderline cardiomegaly, with  mildly increased interstitial markings again noted. This may reflect minimal interstitial edema. No displaced rib fracture seen.   Electronically Signed   By: Roanna RaiderJeffery  Chang M.D.   On: 012-09-2015 05:09   [place] Venous Img Lower Unilateral Left  09-26-2015   CLINICAL DATA:  Lower extremity swelling 3 weeks.  EXAM: Left LOWER EXTREMITY VENOUS DOPPLER ULTRASOUND  TECHNIQUE: Gray-scale sonography with graded compression, as well as color Doppler and duplex ultrasound were performed to evaluate the lower extremity deep venous systems from the level of the common femoral vein and including the common femoral, femoral, profunda femoral, popliteal and calf veins including the posterior tibial, peroneal and gastrocnemius veins when visible. The superficial great saphenous vein was also interrogated. Spectral Doppler was utilized to evaluate flow at rest and with distal augmentation maneuvers in the common femoral, femoral and popliteal veins.  COMPARISON:  06/02/2014  FINDINGS: Contralateral Common Femoral Vein: Respiratory phasicity is normal and symmetric with the symptomatic side. No evidence of thrombus. Normal compressibility.  Common Femoral Vein: No evidence of thrombus. Normal compressibility, respiratory phasicity and response to augmentation.  Saphenofemoral Junction: No evidence of thrombus. Normal compressibility and flow on color Doppler imaging.  Profunda Femoral Vein: No evidence of thrombus. Normal compressibility and flow on color Doppler imaging.  Femoral Vein: No evidence of thrombus. Normal compressibility, respiratory phasicity and response to augmentation.  Popliteal Vein: No evidence of thrombus. Normal compressibility, respiratory phasicity and response to augmentation. Minimal linear focal echogenic region along the wall which may represent calcification or sequelae of old thrombus.  Calf Veins: No evidence of thrombus. Normal compressibility and flow on color Doppler imaging.  Superficial Great  Saphenous Vein: No evidence of thrombus. Normal compressibility and flow on color Doppler imaging.  Venous Reflux:  None.  Other Findings:  None.  IMPRESSION: No evidence of deep venous thrombosis.   Electronically Signed   By: Elberta Fortis M.D.   On: 05-10-15 07:48   Dg Hip Operative Unilat With Pelvis Left  05/10/15   CLINICAL DATA:  Left hip fracture/reduction.  EXAM: OPERATIVE left HIP (WITH PELVIS IF PERFORMED) 3 VIEWS  TECHNIQUE: Fluoroscopic spot image(s) were submitted for interpretation post-operatively.  FLUOROSCOPY TIME:  Radiation Exposure Index (as provided by the fluoroscopic device):  If the device does not provide the exposure index:  Fluoroscopy Time:  0 minutes 53 seconds  Number of Acquired Images:  3  COMPARISON:  2015/05/10  FINDINGS: Examination demonstrates placement of an intra medullary nail with associated compression screw bridging the femoral neck into the femoral head as hardware appears intact and normally located. There is anatomic alignment about patient's trochanteric fracture. Recommend correlation with findings at the time of the procedure.  IMPRESSION: Fixation of patient's left intertrochanteric fracture with hardware intact and anatomic alignment about the fracture site.   Electronically Signed   By: Elberta Fortis M.D.   On: 2015/05/10 14:38   Dg Hip Unilat With Pelvis 2-3 Views Left  May 10, 2015   CLINICAL DATA:  Status post fall forward in hallway. Left groin pain and difficulty moving left leg. Initial encounter.  EXAM: LEFT HIP (WITH PELVIS) 2-3 VIEWS  COMPARISON:  None.  FINDINGS: There is a displaced mildly comminuted intertrochanteric fracture involving the proximal left femur, with extension into the proximal femoral diaphysis. There is mild lateral displacement of the distal femur. The right hip joint is grossly unremarkable. The left femoral head remains seated at the acetabulum.  The sacroiliac joints are grossly unremarkable. Scattered vascular  calcifications are seen. The visualized bowel gas pattern is unremarkable.  IMPRESSION: Displaced mildly comminuted intertrochanteric fracture involving the proximal left femur, with extension into the proximal femoral diaphysis. Mild lateral displacement of the distal femur.   Electronically Signed   By: Roanna Raider M.D.   On: May 10, 2015 05:08     Assessment and Plan  81 yo male with history of DM, HLD, HTN without documented CAD with history of PVCs/Bigeminy, COPD and renal insufficiency on chronic Plavix who is admitted after a fall and found to have a left hip fracture s/p intertrochanteric repair 6/26.     1. Acute diastolic CHF: Try to minimize IV fluids given SOB requirement of increased oxygen. CXR showed interval development of low-grade pulmonary interstitial edema. Slight bump in SCr makes diuresis a delicate balance. Would decrease IV fluids down to 25 mL/hr in the setting of his SOB and CXR findings. Advance Lasix as tolerated for diuresis.     2. Acute on CKD stage III: Hold Lasix this morning. Morning renal function. Possibly 2/2 ATN in the setting of hypotension and hypoxia.  2. Pre-operative risk assessment: He has no known ischemic heart disease. He is treated for diastolic CHF with Lasix daily, mostly manifesting as lower extremity edema. He has had no change in his breathing recently and has had no chest pain to suggest angina. He is at least moderately mobile at baseline, using a cane to walk. He does take ASA and Plavix daily and will be at higher risk of post-operative bleeding, it is unclear why he is on these two medications at this time as there is no documented CAD. He is at least at moderate risk for any surgical procedure given his advanced age but he and his family understand that he will not do well if he does not have the surgical repair of his hip fracture and they are willing to proceed, while understanding the associated risk.   3. PVCs: He has a long history of  PVCs with bigeminy. May need to use prn Lopressor in the peri-operative period if he has increase in frequency of PVCs.   4. Hip fracture: per ortho  5. Aortic stenosis: Echo on 03/2012 does not appear to have progressed much from mild to moderate stenosis in 2013. Gradients appear to indicate mild to moderate stenosis. Would continue to monitor at this time. Mild to moderate by echo 2013. More recent echo in Dr. Renie Ora office. He has a thickened, calcified aortic valve with moderate to severe stenosis. Mean gradient . There is moderate AI.     SignedEula Listen, PA-C Pager: [phone] 04/11/2015, 8:28 AM   Attending Note Patient seen and examined, agree with detailed note above,  Patient presentation and plan discussed on rounds.   Significant hypoxia with holding the facemask Saturations down to the 70s on room air Stable high 80s, up to 90 on full facemask. Likely from underlying COPD Prior echocardiogram with mildly elevated right heart pressures. He has received significant IV fluids through his operative course.  We'll recommend we decrease his IV fluids to avoid acute on chronic diastolic CHF  Signed: Dossie Arbour  M.D., Ph.D.

## 2015-04-11 NOTE — Care Management Note (Signed)
Case Management Note  Patient Details  Name: Charlsie QuestMelvin H Cain MRN: [id number] Date of Birth: 05/27/1925  Subjective/Objective:  Consult received for discharge planning. Chart reviewed and it is noted that PT to start today. S/P left hip fracture with repair 03/19/2015. CSW following as it is anticipated patient will need SNF at discharge.                   Action/Plan:   Expected Discharge Date:  04/12/2015               Expected Discharge Plan:  Skilled Nursing Facility  In-House Referral:  Clinical Social Work  Discharge planning Services  CM Consult  Post Acute Care Choice:    Choice offered to:     DME Arranged:    DME Agency:     HH Arranged:    HH Agency:     Status of Service:  In process, will continue to follow  Medicare Important Message Given:    Date Medicare IM Given:    Medicare IM give by:    Date Additional Medicare IM Given:    Additional Medicare Important Message give by:     If discussed at Long Length of Stay Meetings, dates discussed:    Additional Comments:  Marily MemosLisa M Porchea Charrier, RN 04/11/2015, 10:36 AM

## 2015-04-11 NOTE — Progress Notes (Addendum)
Inpatient Diabetes Program Recommendations  AACE/ADA: New Consensus Statement on Inpatient Glycemic Control (2013)  Target Ranges:  Prepandial:   less than 140 mg/dL      Peak postprandial:   less than 180 mg/dL (1-2 hours)      Critically ill patients:  140 - 180 mg/dL     Results for Micheal George, Micheal George (MRN [id number]) as of 04/11/2015 09:52  Ref. Range 04/11/2015 07:21  Glucose-Capillary Latest Ref Range: 65-99 mg/dL 409330 (George)    Admit with: L Hip Fracture  History: DM, COPD, CKD, CHF  Home DM Meds: Levemir 20 units QHS  Current DM Orders: Novolog Sensitive SSI (0-9 units) tid ac + HS    **Patient currently receiving IV Solumedrol 60 mg Q8 hours.  Pt did receive 125 mg IV Solumedrol yesterday at 4PM.  **Glucose levels significantly elevated.   MD- Please consider the following in-hospital insulin adjustments:  1. Add back home dose of Levemir- Levemir 20 units QHS  2. Increase Novolog SSI to Moderate scale (0-15 units) tid ac + HS    Will follow Ambrose FinlandJeannine Johnston Donnielle Addison RN, MSN, CDE Diabetes Coordinator Inpatient Glycemic Control Team Team Pager: [phone] (8a-5p)

## 2015-04-11 NOTE — Progress Notes (Signed)
Physical Therapy Treatment Patient Details Name: Micheal George MRN: [id number] DOB: [birth year] Today's Date: 04/11/2015    History of Present Illness Pt is an 81 y.o. male s/p fall sustaining a 2 part intertrochanteric fracture.  Pt s/p ORIF L hip 03/24/2015.  Pt also with mild CHF, hypoxia, and heart murmur.  Pt currently has R arterial femoral line and on high flow nasal cannula.    PT Comments    Deferred OOB mobility d/t R femoral arterial line.  Pt progressing with repetitions of LE bed ex's and reporting minimal L hip pain with ex's.  Will continue to progress pt per pt tolerance and assess OOB mobility as able/appropriate.  Follow Up Recommendations  SNF     Equipment Recommendations  Rolling walker with 5" wheels    Recommendations for Other Services       Precautions / Restrictions Precautions Precautions: Fall Precaution Comments: R arterial femoral line Restrictions Weight Bearing Restrictions: Yes LLE Weight Bearing: Weight bearing as tolerated    Mobility  Bed Mobility               General bed mobility comments: Mobility deferred d/t R arterial femoral line.  Transfers                    Ambulation/Gait                 Stairs            Wheelchair Mobility    Modified Rankin (Stroke Patients Only)       Balance                                    Cognition Arousal/Alertness: Awake/alert Behavior During Therapy: WFL for tasks assessed/performed Overall Cognitive Status: Within Functional Limits for tasks assessed                 Exercises   Performed semi-supine B LE therapeutic exercise x 15 reps:  Ankle pumps (AROM B LE's); quad sets x3 second holds (AROM B LE's); glute squeezes x3 second holds (AROM B); SAQ's (AAROM L); heelslides (AAROM L), hip abd/adduction (AAROM L).  Pt required vc's and tactile cues for correct technique with exercises.     General Comments  Nursing cleared pt for  participation in physical therapy.  Pt's daughter present.  Pt agreeable to PT session.      Pertinent Vitals/Pain Pain Assessment: 0-10 Pain Score: 2  Pain Location: L hip Pain Descriptors / Indicators: Tender;Sore;Aching Pain Intervention(s): Limited activity within patient's tolerance;Monitored during session;Repositioned  BP 127/69, HR 94 bpm, and O2 97% on HFNC end of session.    Home Living Family/patient expects to be discharged to:: Skilled nursing facility Living Arrangements: Spouse/significant other   Type of Home: House Home Access: Ramped entrance   Home Layout: One level Home Equipment: Cane - single point;Shower seat      Prior Function Level of Independence: Independent with assistive device(s)  Gait / Transfers Assistance Needed: Uses SPC       PT Goals (current goals can now be found in the care plan section) Acute Rehab PT Goals Patient Stated Goal: To get better PT Goal Formulation: With patient Time For Goal Achievement: 04/25/15 Potential to Achieve Goals: Good Additional Goals Additional Goal #1: Assess OOB mobility as appropriate and update POC/goals at that time. Progress towards PT goals: Progressing toward goals  Frequency  BID    PT Plan Current plan remains appropriate    Co-evaluation             End of Session Equipment Utilized During Treatment: Oxygen (HFNC) Activity Tolerance: Patient limited by fatigue Patient left: in bed;with call bell/phone within reach;with bed alarm set;with family/visitor present     Time: 4540-9811 PT Time Calculation (min) (ACUTE ONLY): 15 min  Charges:  $Therapeutic Exercise: 8-22 mins                    G CodesHendricks Limes 05/05/2015, 4:39 PM  Hendricks Limes, PT [phone]

## 2015-04-11 NOTE — Plan of Care (Signed)
Problem: Phase I Progression Outcomes Goal: Post op pain controlled with appropriate interventions Outcome: Progressing Denies need for pain med Goal: Post op hemodynamically stable Outcome: Progressing Still requires high flow nasal O2 to maintain sat greater than 90. Sats 77-82% on 6l/[place].  SR with frequent to occassional  multifocal PVC's and few short runs  VT. Goal: Post op CMS/Neurovascular status WDL Outcome: Progressing No neurovascular deficit left leg Goal: Post op clear liquids, advance diet as tolerated Outcome: Progressing SLT swallow eval.  Thickened liquids and purreed foods

## 2015-04-11 NOTE — Clinical Social Work Note (Signed)
Clinical Social Work Assessment  Patient Details  Name: Micheal George MRN: [id number] Date of Birth: 1925-03-16  Date of referral:  04/11/15               Reason for consult:  Facility Placement                Permission sought to share information with:  Micheal George granted to share information::  Yes, Verbal Permission Granted  Name::      Micheal George::   [facility]   Relationship::     Contact Information:     Housing/Transportation Living arrangements for Micheal past 2 months:  [place] of Information:  Patient, Adult Children Patient Interpreter Needed:  None Criminal Activity/Legal Involvement Pertinent to Current Situation/Hospitalization:  No - Comment as needed Significant Relationships:  Adult Children, Spouse Lives with:  Spouse Do you feel safe going back to [facility] place where you live?  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    Social Worker assessment / plan:  Holiday representative (CSW) received SNF consult. PT is recommending SNF. CSW met with patient and his daughter Micheal George was at bedside. CSW introduced self and explained role of CSW department. Patient reported that he lives with his wife in [place]. Patient's wife recently was discharged from [facility] [place] and is still using a walker. Patient has been caring for his wife at home. Patient has 2 daughters Micheal George, who lives in [place] and Miche[place] George who lives in [place] and is also a caregiver for patient and his wife. Patient reported that he would prefer to go home then rehab. Patient has been to [facility] in [facility] past. CSW provided emotional support and encouragement. Patient is agreeable to SNF. Daughter prefers [facility] SNF list was provided to daughter. CSW encouraged daughter to pick top 3 SNF choices. Daughter verbalized her understanding.   FL2  complete and faxed out.    Employment status:  Retired Nurse, adult PT Recommendations:  [place] / Referral to community resources:  [place]  Patient/Family's Response to care:  Patient and daughter are agreeable to [facility].   Patient/Family's Understanding of and Emotional Response to Diagnosis, Current Treatment, and Prognosis:  Patient was pleasant and thanked CSW for visit.    Emotional Assessment Appearance:  Appears stated age Attitude/Demeanor/Rapport:    Affect (typically observed):  Pleasant, Accepting Orientation:  Oriented to Self, Oriented to Place, Oriented to  Time, Oriented to Situation Alcohol / Substance use:  Not Applicable Psych involvement (Current and /or in [place] community):  No (Comment)  Discharge Needs  Concerns to be addressed:  Discharge Planning Concerns Readmission within Micheal last 30 days:  No Current discharge risk:  Chronically ill Barriers to Discharge:  Continued Medical Work up   Micheal George 04/11/2015, 3:58 PM

## 2015-04-11 NOTE — Evaluation (Signed)
Clinical/Bedside Swallow Evaluation Patient Details  Name: Micheal QuestMelvin H Hebert MRN: [id number] Date of Birth: 02/03/1925  Today's Date: 04/11/2015 Time: SLP Start Time (ACUTE ONLY): 1102 SLP Stop Time (ACUTE ONLY): 1144 SLP Time Calculation (min) (ACUTE ONLY): 42 min  Past Medical History:  Past Medical History  Diagnosis Date  . COPD (chronic obstructive pulmonary disease)   . Diabetes mellitus   . Neuropathy   . Thyroid disease     hypothyroidism  . Renal failure   . Unsteady gait   . HTN (hypertension)   . Hyperlipidemia   . Dysrhythmia   . Hypothyroidism   . Aortic stenosis   . LVH (left ventricular hypertrophy)    Past Surgical History:  Past Surgical History  Procedure Laterality Date  . Hemorrhoid surgery  1975   HPI:      Assessment / Plan / Recommendation Clinical Impression  pt presents wtih a moderate oropharyngeal dysphagia charactorized by pt cough and wet vocal quality with thin and nectar thick liquids, as well as solid, and soft solid bolus trials. pt recommended to be on a dysphagia 1 with honey thick liquid diet at this time. st  educated family on st recommendations at this time and aspiration precautions. family able to give verbal  understanding.     Aspiration Risk  Moderate    Diet Recommendation Dysphagia 1 (Puree);Honey   Medication Administration: Crushed with puree    Other  Recommendations     Follow Up Recommendations       Frequency and Duration min 5x/week  1 week   Pertinent Vitals/Pain None reported    SLP Swallow Goals     Swallow Study Prior Functional Status       General Date of Onset: 04/11/15 Type of Study: Bedside swallow evaluation Diet Prior to this Study: NPO Temperature Spikes Noted: No Respiratory Status: Supplemental O2 delivered via (comment) History of Recent Intubation: No Behavior/Cognition: Alert;Cooperative;Pleasant mood Oral Cavity - Dentition: Adequate natural dentition/normal for age Self-Feeding  Abilities: Able to feed self with adaptive devices Patient Positioning: Upright in bed Baseline Vocal Quality: Normal Volitional Cough: Strong Volitional Swallow: Able to elicit    Oral/Motor/Sensory Function Labial Strength: Reduced Lingual Strength: Reduced   Ice Chips Ice chips: Not tested   Thin Liquid Thin Liquid: Impaired Oral Phase Impairments: Reduced labial seal Pharyngeal  Phase Impairments: Cough - Immediate;Throat Clearing - Delayed;Suspected delayed Swallow;Change in Vital Signs;Wet Vocal Quality    Nectar Thick Nectar Thick Liquid: Impaired Pharyngeal Phase Impairments: Cough - Delayed;Multiple swallows;Wet Vocal Quality   Honey Thick Honey Thick Liquid: Within functional limits Presentation: Cup   Puree Puree: Within functional limits   Solid   GO    Solid: Impaired Oral Phase Impairments: Impaired mastication;Reduced lingual movement/coordination;Impaired anterior to posterior transit Oral Phase Functional Implications: Oral residue Pharyngeal Phase Impairments: Throat Clearing - Delayed       Delois Tolbert Harris Sauber 04/11/2015,11:45 AM

## 2015-04-12 LAB — GLUCOSE, CAPILLARY
GLUCOSE-CAPILLARY: 390 mg/dL — AB (ref 65–99)
GLUCOSE-CAPILLARY: 452 mg/dL — AB (ref 65–99)
Glucose-Capillary: 241 mg/dL — ABNORMAL HIGH (ref 65–99)
Glucose-Capillary: 280 mg/dL — ABNORMAL HIGH (ref 65–99)
Glucose-Capillary: 405 mg/dL — ABNORMAL HIGH (ref 65–99)

## 2015-04-12 LAB — BLOOD GAS, ARTERIAL
ACID-BASE DEFICIT: 7.3 mmol/L — AB (ref 0.0–2.0)
Allens test (pass/fail): POSITIVE — AB
BICARBONATE: 16.9 meq/L — AB (ref 21.0–28.0)
FIO2: 0.6 %
O2 Saturation: 97.5 %
PATIENT TEMPERATURE: 37
PCO2 ART: 30 mmHg — AB (ref 32.0–48.0)
PO2 ART: 100 mmHg (ref 83.0–108.0)
pH, Arterial: 7.36 (ref 7.350–7.450)

## 2015-04-12 LAB — CBC
HCT: 30.6 % — ABNORMAL LOW (ref 40.0–52.0)
HEMOGLOBIN: 9.7 g/dL — AB (ref 13.0–18.0)
MCH: 29.6 pg (ref 26.0–34.0)
MCHC: 31.7 g/dL — ABNORMAL LOW (ref 32.0–36.0)
MCV: 93.2 fL (ref 80.0–100.0)
Platelets: 84 10*3/uL — ABNORMAL LOW (ref 150–440)
RBC: 3.28 MIL/uL — ABNORMAL LOW (ref 4.40–5.90)
RDW: 16.6 % — ABNORMAL HIGH (ref 11.5–14.5)
WBC: 12.6 10*3/uL — AB (ref 3.8–10.6)

## 2015-04-12 LAB — BASIC METABOLIC PANEL
Anion gap: 10 (ref 5–15)
BUN: 59 mg/dL — AB (ref 6–20)
CHLORIDE: 110 mmol/L (ref 101–111)
CO2: 20 mmol/L — ABNORMAL LOW (ref 22–32)
Calcium: 8.4 mg/dL — ABNORMAL LOW (ref 8.9–10.3)
Creatinine, Ser: 2.59 mg/dL — ABNORMAL HIGH (ref 0.61–1.24)
GFR calc Af Amer: 24 mL/min — ABNORMAL LOW (ref >60)
GFR calc non Af Amer: 21 mL/min — ABNORMAL LOW (ref >60)
GLUCOSE: 256 mg/dL — AB (ref 65–99)
POTASSIUM: 5.1 mmol/L (ref 3.5–5.1)
Sodium: 140 mmol/L (ref 135–145)

## 2015-04-12 MED ORDER — LORAZEPAM 2 MG/ML IJ SOLN
1.0000 mg | Freq: Four times a day (QID) | INTRAMUSCULAR | Status: DC | PRN
  Administered 2015-04-12: 1 mg via INTRAVENOUS
  Filled 2015-04-12: qty 1

## 2015-04-12 MED ORDER — DILTIAZEM HCL 30 MG PO TABS
30.0000 mg | ORAL_TABLET | Freq: Four times a day (QID) | ORAL | Status: DC
  Administered 2015-04-12 – 2015-04-13 (×4): 30 mg via ORAL
  Filled 2015-04-12 (×4): qty 1

## 2015-04-12 MED ORDER — HALOPERIDOL LACTATE 5 MG/ML IJ SOLN
1.0000 mg | Freq: Four times a day (QID) | INTRAMUSCULAR | Status: DC | PRN
  Administered 2015-04-12 – 2015-04-13 (×2): 1 mg via INTRAVENOUS
  Filled 2015-04-12 (×3): qty 1

## 2015-04-12 MED ORDER — INSULIN DETEMIR 100 UNIT/ML ~~LOC~~ SOLN
25.0000 [IU] | Freq: Every day | SUBCUTANEOUS | Status: DC
  Administered 2015-04-12 – 2015-04-13 (×2): 25 [IU] via SUBCUTANEOUS
  Filled 2015-04-12 (×2): qty 0.25

## 2015-04-12 MED ORDER — INSULIN ASPART 100 UNIT/ML ~~LOC~~ SOLN
0.0000 [IU] | Freq: Three times a day (TID) | SUBCUTANEOUS | Status: DC
  Administered 2015-04-12: 100 [IU] via SUBCUTANEOUS
  Administered 2015-04-12: 15 [IU] via SUBCUTANEOUS
  Administered 2015-04-13: 11 [IU] via SUBCUTANEOUS
  Administered 2015-04-13: 3 [IU] via SUBCUTANEOUS
  Administered 2015-04-13: 2 [IU] via SUBCUTANEOUS
  Filled 2015-04-12: qty 15
  Filled 2015-04-12: qty 2
  Filled 2015-04-12: qty 3
  Filled 2015-04-12: qty 15
  Filled 2015-04-12: qty 11

## 2015-04-12 MED ORDER — AMIODARONE LOAD VIA INFUSION
150.0000 mg | Freq: Once | INTRAVENOUS | Status: AC
  Administered 2015-04-12: 150 mg via INTRAVENOUS
  Filled 2015-04-12: qty 83.34

## 2015-04-12 MED ORDER — AMIODARONE HCL IN DEXTROSE 360-4.14 MG/200ML-% IV SOLN
60.0000 mg/h | INTRAVENOUS | Status: DC
  Administered 2015-04-12 (×3): 60 mg/h via INTRAVENOUS
  Filled 2015-04-12: qty 200

## 2015-04-12 MED ORDER — AMIODARONE HCL IN DEXTROSE 360-4.14 MG/200ML-% IV SOLN
30.0000 mg/h | INTRAVENOUS | Status: DC
  Filled 2015-04-12 (×6): qty 200

## 2015-04-12 MED ORDER — METHYLPREDNISOLONE SODIUM SUCC 40 MG IJ SOLR
40.0000 mg | Freq: Every day | INTRAMUSCULAR | Status: DC
Start: 2015-04-13 — End: 2015-04-14
  Administered 2015-04-13: 40 mg via INTRAVENOUS
  Filled 2015-04-12: qty 1

## 2015-04-12 NOTE — Progress Notes (Signed)
04/12/15 1015  Clinical Encounter Type  Visited With Patient and family together  Visit Type Follow-up  Referral From Chaplain  Consult/Referral To Chaplain  Spiritual Encounters  Spiritual Needs Prayer;Ritual;Emotional  Stress Factors  Patient Stress Factors Exhausted;Loss of control;Major life changes  Family Stress Factors Family relationships;Loss of control;Major life changes  Met with patient & family. Apparent conflict between daughters. Daughter Ginger (CNA) was agitated and continually questioning health care professionals and attempting to direct health care provisions. Patient was resting until Daughter Ginger's arrival. Requested that she lower her voice & not interfere with operations. She asked if she was going to be removed. Advised that it was up to her, but she needed to get control of her behavior. She then sat down and was quite. However, over the past 4 days she has erratically and she must be monitored for the good of the patient.   Chap. Jarita Raval G. [place]

## 2015-04-12 NOTE — Plan of Care (Signed)
Problem: Phase I Progression Outcomes Goal: Post op pain controlled with appropriate interventions Outcome: Progressing Oxycodone q3-4 hr helps with pain and delerium Goal: Post op hemodynamically stable Outcome: Progressing Weaned off high flow cannula to 6L/[place]. Cardizem started for ST140's off and on today Goal: Post op CMS/Neurovascular status WDL Outcome: Progressing No neurovascular deficit to left leg Goal: Post op clear liquids, advance diet as tolerated Outcome: Progressing Pureed diet w/ thick nectar liquds.  Good appetite. No problems swallowing these foods  Problem: Phase II Progression Outcomes Goal: Tolerating diet Outcome: Progressing senna added this eve for mild constipation. Goal: Bed to chair Outcome: Progressing Up to chair for 3 hrs. Moderate assist Goal: Discharge plan established Outcome: Progressing Pt family prefer [facility] facility  Problem: Phase III Progression Outcomes Goal: Incision clean - minimal/no drainage Outcome: Progressing Incision with honey comb dressing CDI

## 2015-04-12 NOTE — Progress Notes (Addendum)
[facility] [place] [facility]   PATIENT NAME: Micheal LivingMelvin George    MR#:  [id number]  DATE OF BIRTH:  11/05/1924  SUBJECTIVE:  Patient says he has left hip pain. He denies chest pain he has some minimal shortness of breath.  REVIEW OF SYSTEMS:    Review of Systems  Unable to perform ROS: medical condition  Psychiatric/Behavioral: Positive for hallucinations.    Tolerating Diet: Nothing by mouth      DRUG ALLERGIES:  No Known Allergies  VITALS:  Blood pressure 131/108, pulse 42, temperature 97.6 F (36.4 C), temperature source Axillary, resp. rate 19, height 5\' 9"  (1.753 m), weight 72.576 kg (160 lb), SpO2 99 %.  PHYSICAL EXAMINATION:   Physical Exam  Constitutional: He is oriented to person, place, and time and well-developed, well-nourished, and in no distress. No distress.  HENT:  Head: Normocephalic.  Eyes: No scleral icterus.  Neck: Normal range of motion. Neck supple. No JVD present. No tracheal deviation present.  Cardiovascular: Normal rate and regular rhythm.  Exam reveals no gallop and no friction rub.   Murmur heard. Pulmonary/Chest: Effort normal. No respiratory distress. He has no wheezes. He has no rales. He exhibits no tenderness.  Crackles at bases  Abdominal: Soft. Bowel sounds are normal. He exhibits no distension and no mass. There is no tenderness. There is no rebound and no guarding.  Musculoskeletal: Normal range of motion. He exhibits no edema.  Neurological: He is alert and oriented to person, place, and time.  Skin: Skin is warm. No rash noted. No erythema.  Psychiatric:  Confused       LABORATORY PANEL:   CBC  Recent Labs Lab 04/12/15 0633  WBC 12.6*  HGB 9.7*  HCT 30.6*  PLT 84*   ------------------------------------------------------------------------------------------------------------------  Chemistries   Recent Labs Lab 04/12/15 0633  NA 140  K 5.1  CL 110  CO2 20*  GLUCOSE 256*  BUN  59*  CREATININE 2.59*  CALCIUM 8.4*   ------------------------------------------------------------------------------------------------------------------  Cardiac Enzymes  Recent Labs Lab September 07, 2015 0426  TROPONINI <0.03   ------------------------------------------------------------------------------------------------------------------  RADIOLOGY:  CXR: Low-grade pulmonary interstitial edema   ASSESSMENT AND PLAN:  81 yo male with history of DM, HLD, HTN without documented CAD with history of PVCs/Bigeminy, COPD and renal insufficiency on chronic Plavix who is admitted after a fall and found to have a left hip fracture s/p intertrochanteric repair 6/26.  1. Acute diastolic heart failure: 2-D echocardiogram showed EF of 50% with mild diastolic dysfunction. Patient has lower extremity edema as well as chest x-ray showing pulmonary edema. Patient's creatinine did increase this morning. I appreciate cardiology consultation. As per cardiology recommendations, hold Lasix for today again..  2. Acute on chronic kidney disease stage III: Likely due to ATN in the setting of hypotension and hypoxia postop. We are holding Lasix for today and repeat BMP in a.m. Avoid nephrotoxic agents.  3. Aortic stenosis: His echo this time shows a thickened, calcified aortic valve with moderate to severe stenosis. Patient will need to have close follow-up with cardiology.   4. COPD: I will continue IV steroids and inhalers.  5. Acute respiratory failure: After his procedure patient had increased O2 requirements. He is transferred to stepdown unit on high flow nasal cannula. We are trying to wean the high flow nasal cannula. I will continue IV steroids and Lasix when okay with cardiology. CXr for am  6. Diabetes: Patient will continue with sliding scale insulin.  7. Thrombocytopenia: Patient's platelets are  101 this morning I will continue to follow. He is on low-dose Lovenox.  8. Hypothyroidism: Continue  Synthroid  9. Nutrition: Continue a dysphagia 1 diet as per speech consultation.  10. Delirium: I suspect this is a combination of being in the intensive care unit as well as IV steroids. He does have some underlying cognitive issues as well leading to delirium. Continue to orient him. Awake in the a.m. and sleep at night. Haldol when necessary has been ordered.   11. Bigeminy: Patient still has bigeminy on telemetry monitoring. We will continue metoprolol and add diltiazem as per cardiology consultation.  12. From a cytopenia: Patient's platelet count is decreasing. Likely secondary to above issues. I will hold Lovenox and place SCDs.  Management plans discussed with the patient's daughter and Dr Mariah Milling CODE STATUS: Full  TOTAL TIME TAKING CARE OF THIS PATIENT: 28 minutes.   Greater than 50% counseling and coordination of care  POSSIBLE D/C 3-4 days , DEPENDING ON CLINICAL CONDITION.   Miamarie Moll M.D on 04/12/2015 at 10:26 AM  Between 7am to 6pm - Pager - [phone] After 6pm go to [email or web address] - password [email or web address] [facility]  [place] [facility]  Office  [phone]  CC: Primary care physician; Corky Downs, MD

## 2015-04-12 NOTE — Progress Notes (Signed)
Inpatient Diabetes Program Recommendations  AACE/ADA: New Consensus Statement on Inpatient Glycemic Control (2013)  Target Ranges:  Prepandial:   less than 140 mg/dL      Peak postprandial:   less than 180 mg/dL (1-2 hours)      Critically ill patients:  140 - 180 mg/dL     Addendum [id number]:  Spoke with Dr. Juliene PinaMody via phone about this patient's fingerstick glucose levels.  Received orders to:  1. Increase Levemir to 25 units QHS  2. Increase Novolog SSI to Moderate scale   Orders placed into EPIC.  Called RN to review new orders.   Will follow Ambrose FinlandJeannine Johnston Rani Idler RN, MSN, CDE Diabetes Coordinator Inpatient Glycemic Control Team Team Pager: [phone] (8a-5p)

## 2015-04-12 NOTE — Progress Notes (Signed)
Speech Language Pathology Dysphagia Treatment Patient Details Name: Charlsie QuestMelvin H Mesmer MRN: [id number] DOB: 08/30/1925 Today's Date: 04/12/2015 Time: 4098-11911248-1313 SLP Time Calculation (min) (ACUTE ONLY): 25 min  Assessment / Plan / Recommendation Clinical Impression       Diet Recommendation       SLP Plan Continue with current plan of care   Pertinent Vitals/Pain None reported   Swallowing Goals     General Behavior/Cognition: Alert;Cooperative;Pleasant mood Patient Positioning: Upright in bed Oral care provided: N/A  Oral Cavity - Oral Hygiene     Dysphagia Treatment Treatment Methods: Skilled observation;Patient/caregiver education Patient observed directly with PO's: Yes Type of PO's observed: Dysphagia 1 (puree);Honey-thick liquids Feeding: Able to feed self Liquids provided via: Cup Oral Phase Signs & Symptoms: Prolonged mastication Pharyngeal Phase Signs & Symptoms: Delayed cough Type of cueing: Verbal Amount of cueing: Modified independent   GO     Meredith PelStacie Harris Sauber 04/12/2015, 1:56 PM

## 2015-04-12 NOTE — Progress Notes (Signed)
   04/11/15 0800  Clinical Encounter Type  Visited With Patient and family together  Visit Type Follow-up  Referral From Family  Consult/Referral To Chaplain  Spiritual Encounters  Spiritual Needs Prayer;Emotional  Stress Factors  Patient Stress Factors Exhausted;Health changes;Major life changes  Family Stress Factors Major life changes  Met w/patient & daughter. Patient appeared in good spirits and alert. Requested prayer and we prayed.  Chap. Yeshua Stryker G. [place]

## 2015-04-12 NOTE — Progress Notes (Signed)
PT Cancellation Note  Patient Details Name: Micheal George MRN: [id number] DOB: 12/24/1924   Cancelled Treatment:    Reason Eval/Treat Not Completed: Other (comment) (Nursing recommending holding PT this AM d/t pt very agitated this AM.  Will re-attempt PT treatment in afternoon as able/appropriate.)   [name] 04/12/2015, 10:28 AM [name], PT [phone]

## 2015-04-12 NOTE — Progress Notes (Signed)
Pt started to become anxious and yelling and cussing at staff attempting to get up . Pt was given ativan as ordered.pt also given pain meds as ordered RR increased 30's with . AB G ordered . Will cont to monitor

## 2015-04-12 NOTE — Care Management (Signed)
Important Message  Patient Details  Name: Charlsie QuestMelvin H Vensel MRN: [id number] Date of Birth: 12/07/1924   Medicare Important Message Given:  Yes-second notification given    Olegario MessierKathy A Allmond 04/12/2015, 10:12 AM

## 2015-04-12 NOTE — Progress Notes (Signed)
  Subjective: 2 Days Post-Op Procedure(s) (LRB): INTRAMEDULLARY (IM) NAIL INTERTROCHANTRIC (Left) Patient is unable to report pain.  Extremely confused and drowsy. Patient is having issues with confusion.  Nursing staff reports that he is sun-downing Plan is to go Skilled nursing facility after hospital stay. Negative for chest pain and shortness of breath Fever: no Gastrointestinal:Negative for nausea and vomiting  Objective: Vital signs in last 24 hours: Temp:  [97.4 F (36.3 C)-98.1 F (36.7 C)] 98.1 F (36.7 C) (06/28 0400) Pulse Rate:  [25-93] 42 (06/27 1940) Resp:  [16-33] 18 (06/28 0400) BP: (106-159)/(45-98) 146/98 mmHg (06/28 0400) SpO2:  [83 %-100 %] 97 % (06/28 0400) Arterial Line BP: (101-139)/(33-59) 121/42 mmHg (06/27 1940) FiO2 (%):  [60 %-70 %] 60 % (06/28 0233)  Intake/Output from previous day:  Intake/Output Summary (Last 24 hours) at 04/12/15 0720 Last data filed at 04/12/15 0100  Gross per 24 hour  Intake 434.58 ml  Output    435 ml  Net  -0.42 ml    Intake/Output this shift:    Labs:  Recent Labs  03/19/2015 0426 04/11/15 0528 04/12/15 0633  HGB 13.6 11.1* 9.7*    Recent Labs  04/11/15 0528 04/12/15 0633  WBC 10.3 12.6*  RBC 3.67* 3.28*  HCT 33.9* 30.6*  PLT 101* 84*    Recent Labs  04/02/2015 0426 04/11/15 0528 04/11/15 1253  NA 142 141  --   K 5.0 5.4* 5.4*  CL 114* 114*  --   CO2 22 18*  --   BUN 42* 46*  --   CREATININE 1.79* 2.22*  --   GLUCOSE 110* 367*  --   CALCIUM 9.1 8.5*  --     Recent Labs  03/28/2015 0600  INR 1.27     EXAM General - Patient is Disorganized, Confused and Lacking Extremity - ABD soft Sensation intact distally Intact pulses distally Incision: scant drainage Dressing/Incision - blood tinged drainage Motor Function - intact, moving foot and toes well on exam.  Past Medical History  Diagnosis Date  . COPD (chronic obstructive pulmonary disease)   . Diabetes mellitus   . Neuropathy   .  Thyroid disease     hypothyroidism  . Renal failure   . Unsteady gait   . HTN (hypertension)   . Hyperlipidemia   . Dysrhythmia   . Hypothyroidism   . Aortic stenosis   . LVH (left ventricular hypertrophy)     Assessment/Plan: 2 Days Post-Op Procedure(s) (LRB): INTRAMEDULLARY (IM) NAIL INTERTROCHANTRIC (Left) Active Problems:   Hip fracture   COLD (chronic obstructive lung disease)   Fall at home   Chronic diastolic CHF (congestive heart failure)   Hip fracture, left   Hypoxia  Estimated body mass index is 23.62 kg/(m^2) as calculated from the following:   Height as of this encounter: 5\' 9"  (1.753 m).   Weight as of this encounter: 72.576 kg (160 lb). Advance diet Up with therapy  K+ was 5.4 yesterday.  He has received insulin, D50 and Kayexalate taken without a stool so far.  Will check K+ today. Nursing staff reports that he began yelling and cursing at staff, Ativan was ordered. He continues to be on high flow O2, unable to wean off currently, at 96% Aline was d/c Foley has been removed, bladder scan demonstrated that he is emptying his bladder well. DVT Prophylaxis - Lovenox, Foot Pumps and TED hose Weight-Bearing as tolerated to left leg  J. Horris LatinoLance McGhee, PA-C [facility] 04/12/2015, 7:20 AM

## 2015-04-12 NOTE — Progress Notes (Addendum)
Patient: Micheal George / Admit Date: 04/02/2015 / Date of Encounter: 04/12/2015, 7:56 AM   SubjectivStable respiratory statuse: Very confused this morning. ABG checked early morning with CO2 of 30. Continues on HFNC. Cannot tolerate nasal cannula 2/2 desats. He is mostly breathing through his mouth, though he pulls off any face mask. Working with nursing aid attempting to eat some ice cream. Renal function continues to trend up with SCr 1.79-->2.22-->2.59 today. BUN 42-->46-->59.   Review of Systems: Unable to perform ROS secondary to confusion.     Objective: Telemetry: NSR, 80's-90's, frequent ventricular bigeminy. Review of telemetry does not show heart rates that have been bradycardic (20's to 30's) as indicated by vital signs. Tele indicates heart rate in the 80's to 90's throughout his stay with brief episodes in the low 100's.  Physical Exam: Blood pressure 125/52, pulse 42, temperature 97.6 F (36.4 C), temperature source Axillary, resp. rate 17, height  (1.753 m), weight 160 lb (72.576 kg), SpO2 96 %. Body mass index is 23.62 kg/(m^2). General: Well developed, well nourished, confused, in no acute distress. Head: Normocephalic, atraumatic, sclera non-icteric, no xanthomas, nares are without discharge. Neck: Negative for carotid bruits. JVP not elevated. Lungs: Diffuse bilateral crackles and rhonchi. Breathing is unlabored. Heart: RRR S1 S2. III/VI systolic murmur RUSB and apex, I/VI diastolic murmur LUSB. No rubs or gallops.  Abdomen: Soft, non-tender, non-distended with normoactive bowel sounds. No rebound/guarding. Extremities: No clubbing or cyanosis. No edema. Distal pedal pulses are 2+ and equal bilaterally. Neuro: Alert. Moves all extremities spontaneously. Psych:  Confused.   Intake/Output Summary (Last 24 hours) at 04/12/15 0756 Last data filed at 04/12/15 0100  Gross per 24 hour  Intake 434.58 ml  Output    435 ml  Net  -0.42 ml    Inpatient Medications:   . aspirin EC  81 mg Oral Daily  . clopidogrel  75 mg Oral Daily  . docusate sodium  100 mg Oral BID  . enoxaparin (LOVENOX) injection  30 mg Subcutaneous Q24H  . gabapentin  100 mg Oral QHS  . hydrALAZINE  50 mg Oral BID  . insulin aspart  0-5 Units Subcutaneous QHS  . insulin aspart  0-9 Units Subcutaneous TID WC  . insulin detemir  20 Units Subcutaneous QHS  . ipratropium-albuterol  3 mL Nebulization Q6H  . levothyroxine  50 mcg Oral QAC breakfast  . methylPREDNISolone (SOLU-MEDROL) injection  40 mg Intravenous Q8H  . mometasone-formoterol  2 puff Inhalation BID  . pantoprazole  40 mg Oral Daily  . simvastatin  20 mg Oral Daily  . sodium chloride  3 mL Intravenous Q12H   Infusions:  . sodium chloride 20 mL/hr at 04/11/15 1800    Labs:  Recent Labs  03/30/2015 0426 04/11/15 0528 04/11/15 1253  NA 142 141  --   K 5.0 5.4* 5.4*  CL 114* 114*  --   CO2 22 18*  --   GLUCOSE 110* 367*  --   BUN 42* 46*  --   CREATININE 1.79* 2.22*  --   CALCIUM 9.1 8.5*  --    No results for input(s): AST, ALT, ALKPHOS, BILITOT, PROT, ALBUMIN in the last 72 hours.  Recent Labs  03/23/2015 0426 04/11/15 0528 04/12/15 0633  WBC 7.9 10.3 12.6*  NEUTROABS 5.0  --   --   HGB 13.6 11.1* 9.7*  HCT 41.7 33.9* 30.6*  MCV 92.1 92.3 93.2  PLT 119* 101* 84*    Recent Labs  03/26/2015 0426  TROPONINI <0.03   Invalid input(s): POCBNP No results for input(s): HGBA1C in the last 72 hours.   Weights: Filed Weights   03/23/2015 0414  Weight: 160 lb (72.576 kg)     Radiology/Studies:  Dg Chest 1 View  04/11/2015   CLINICAL DATA:  COPD and CHF, hypoxia postop hip fracture  EXAM: CHEST  1 VIEW  COMPARISON:  Portable chest x-ray of April 10, 2015  FINDINGS: The lungs are adequately inflated. The interstitial markings are slightly more conspicuous bilaterally today. The cardiac silhouette is enlarged. The central pulmonary vascularity is slightly more congested. The mediastinum is normal in width.  The bony thorax exhibits no acute abnormality.  IMPRESSION: Interval development of low-grade pulmonary interstitial edema likely secondary to CHF.   Electronically Signed   By: David  [place] M.D.   On: 04/11/2015 07:32   Dg Chest 1 View  03/16/2015   CLINICAL DATA:  Postop hip fracture today now with hypoxia  EXAM: CHEST  1 VIEW  COMPARISON:  03/20/2015  FINDINGS: Mild cardiac enlargement stable. Central pulmonary arteries prominent but stable. Vascular pattern normal. No consolidation or effusion.  IMPRESSION: No acute findings   Electronically Signed   By: Esperanza Heir M.D.   On: 03/29/2015 15:22   Dg Chest 1 View  03/23/2015   CLINICAL DATA:  Status post fall forward in hallway. Concern for chest injury. Initial encounter.  EXAM: CHEST  1 VIEW  COMPARISON:  Chest radiograph performed 12/23/2014  FINDINGS: Vascular congestion is noted, with mildly increased interstitial markings, possibly reflecting minimal interstitial edema. No pleural effusion or pneumothorax is seen.  The cardiomediastinal silhouette is borderline enlarged. No acute osseous abnormalities are identified.  IMPRESSION: Vascular congestion and borderline cardiomegaly, with mildly increased interstitial markings again noted. This may reflect minimal interstitial edema. No displaced rib fracture seen.   Electronically Signed   By: Roanna Raider M.D.   On: 03/28/2015 05:09   US Venous Img Lower Unilateral Left  04/12/2015   CLINICAL DATA:  Lower extremity swelling 3 weeks.  EXAM: Left LOWER EXTREMITY VENOUS DOPPLER ULTRASOUND  TECHNIQUE: Gray-scale sonography with graded compression, as well as color Doppler and duplex ultrasound were performed to evaluate the lower extremity deep venous systems from the level of the common femoral vein and including the common femoral, femoral, profunda femoral, popliteal and calf veins including the posterior tibial, peroneal and gastrocnemius veins when visible. The superficial great saphenous vein  was also interrogated. Spectral Doppler was utilized to evaluate flow at rest and with distal augmentation maneuvers in the common femoral, femoral and popliteal veins.  COMPARISON:  06/02/2014  FINDINGS: Contralateral Common Femoral Vein: Respiratory phasicity is normal and symmetric with the symptomatic side. No evidence of thrombus. Normal compressibility.  Common Femoral Vein: No evidence of thrombus. Normal compressibility, respiratory phasicity and response to augmentation.  Saphenofemoral Junction: No evidence of thrombus. Normal compressibility and flow on color Doppler imaging.  Profunda Femoral Vein: No evidence of thrombus. Normal compressibility and flow on color Doppler imaging.  Femoral Vein: No evidence of thrombus. Normal compressibility, respiratory phasicity and response to augmentation.  Popliteal Vein: No evidence of thrombus. Normal compressibility, respiratory phasicity and response to augmentation. Minimal linear focal echogenic region along the wall which may represent calcification or sequelae of old thrombus.  Calf Veins: No evidence of thrombus. Normal compressibility and flow on color Doppler imaging.  Superficial Great Saphenous Vein: No evidence of thrombus. Normal compressibility and flow on color Doppler imaging.  Venous  Reflux:  None.  Other Findings:  None.  IMPRESSION: No evidence of deep venous thrombosis.   Electronically Signed   By: Elberta Fortisaniel  Boyle M.D.   On: 10/11/15 07:48   Dg Hip Operative Unilat With Pelvis Left  08-Nov-2014   CLINICAL DATA:  Left hip fracture/reduction.  EXAM: OPERATIVE left HIP (WITH PELVIS IF PERFORMED) 3 VIEWS  TECHNIQUE: Fluoroscopic spot image(s) were submitted for interpretation post-operatively.  FLUOROSCOPY TIME:  Radiation Exposure Index (as provided by the fluoroscopic device):  If the device does not provide the exposure index:  Fluoroscopy Time:  0 minutes 53 seconds  Number of Acquired Images:  3  COMPARISON:  10/11/15  FINDINGS:  Examination demonstrates placement of an intra medullary nail with associated compression screw bridging the femoral neck into the femoral head as hardware appears intact and normally located. There is anatomic alignment about patient's trochanteric fracture. Recommend correlation with findings at the time of the procedure.  IMPRESSION: Fixation of patient's left intertrochanteric fracture with hardware intact and anatomic alignment about the fracture site.   Electronically Signed   By: Elberta Fortisaniel  Boyle M.D.   On: 10/11/15 14:38   Dg Hip Unilat With Pelvis 2-3 Views Left  08-Nov-2014   CLINICAL DATA:  Status post fall forward in hallway. Left groin pain and difficulty moving left leg. Initial encounter.  EXAM: LEFT HIP (WITH PELVIS) 2-3 VIEWS  COMPARISON:  None.  FINDINGS: There is a displaced mildly comminuted intertrochanteric fracture involving the proximal left femur, with extension into the proximal femoral diaphysis. There is mild lateral displacement of the distal femur. The right hip joint is grossly unremarkable. The left femoral head remains seated at the acetabulum.  The sacroiliac joints are grossly unremarkable. Scattered vascular calcifications are seen. The visualized bowel gas pattern is unremarkable.  IMPRESSION: Displaced mildly comminuted intertrochanteric fracture involving the proximal left femur, with extension into the proximal femoral diaphysis. Mild lateral displacement of the distal femur.   Electronically Signed   By: Roanna RaiderJeffery  Chang M.D.   On: 10/11/15 05:08     Assessment and Plan  81 yo male with history of DM, HLD, HTN without documented CAD with history of PVCs/Bigeminy, COPD and renal insufficiency on chronic Plavix who is admitted after a fall and found to have a left hip fracture s/p intertrochanteric repair 6/26.    1. Acute diastolic CHF: Try to minimize IV fluids given SOB requirement of increased oxygen. CXR showed interval development of low-grade pulmonary  interstitial edema. Slight bump in SCr makes diuresis a delicate balance. IV fluids have been stopped. Advance Lasix as tolerated for diuresis pending renal function.   2. Acute on CKD stage III: Continue to hold Lasix this morning. Renal function continuing to trend up this morning. Possibly 2/2 ATN in the setting of hypotension and hypoxia.    2. Pre-operative risk assessment: He has no known ischemic heart disease. He is treated for diastolic CHF with Lasix daily, mostly manifesting as lower extremity edema. He has had no change in his breathing recently and has had no chest pain to suggest angina. He is at least moderately mobile at baseline, using a cane to walk. He does take ASA and Plavix daily and will be at higher risk of post-operative bleeding, it is unclear why he is on these two medications at this time as there is no documented CAD. He is at least at moderate risk for any surgical procedure given his advanced age but he and his family understand that he will  not do well if he does not have the surgical repair of his hip fracture and they are willing to proceed, while understanding the associated risk.   3. PVCs: He has a long history of PVCs with bigeminy. May need to use prn Lopressor in the peri-operative period if he has increase in frequency of PVCs.   4. Hip fracture: per ortho  5. Aortic stenosis: Echo on 03/2012 does not appear to have progressed much from mild to moderate stenosis in 2013. Gradients appear to indicate mild to moderate stenosis. Would continue to monitor at this time. Mild to moderate by echo 2013. More recent echo in Dr. Renie Ora office. He has a thickened, calcified aortic valve with moderate to severe stenosis. Mean gradient . There is moderate AI.  6. Sundowning: PRN Ativan led to increase in agitation. Dilaudid seemed to help more overnight.    SignedEula Listen, PA-C Pager: [phone] 04/12/2015, 7:56 AM   Attending Note Patient seen  and examined, agree with detailed note above,  Patient presentation and plan discussed on rounds.   Significant agitation overnight requiring Ativan, dilaudid for pain Comfortable this morning with confusion Continues to have high flow nasal oxygen to maintain saturations in the high 80s -90 IV fluids have been held, worsening renal function likely from ATN Notes and family indicate period of hypotension prior to surgery likely contribute into underlying acute renal failure  -Would try to limit fluid intake given his tenuous respiratory status, we'll hold Lasix for now. Lasix provided yesterday with minimum urine output Continue aggressive respiratory support (nebulizers, may need prednisone if symptoms progress ) No further cardiac workup at this time    Signed: Dossie Arbour  M.D., Ph.D.

## 2015-04-12 NOTE — Progress Notes (Signed)
CSW following for dc planning. Pt is planning to go to SNF at DC. He has selected [facility] for STR at DC. Family at bedside and in agreement. CSW updated SNF with dc plan. CSW will continue to follow as Pt's care progresses.   Wilford Gristara Altonio Schwertner, LCSW [phone]

## 2015-04-12 NOTE — Progress Notes (Signed)
Physical Therapy Treatment Patient Details Name: Micheal George MRN: [id number] DOB: 10/07/1925 Today's Date: 04/12/2015    History of Present Illness Pt is an 81 y.o. male s/p fall sustaining a 2 part intertrochanteric fracture.  Pt s/p ORIF L hip 03/20/2015.  Pt also with mild CHF, hypoxia, and heart murmur.      PT Comments    Pt with increased confusion today but was able to get OOB and transfer to recliner chair with 2 assist and RW.  Pt's HR 102 bpm laying in bed and 104 bpm upon sitting on edge of bed.  Pt's HR noted to be in 140's-150's upon getting to chair but within a couple minutes decreased back down to 109 bpm (nursing reporting pt had HR increases briefly to 140's earlier in day without any physical exertion and contacted cardiology).  Continue to recommend pt discharge to STR when medically appropriate.  Follow Up Recommendations  SNF     Equipment Recommendations  Rolling walker with 5" wheels    Recommendations for Other Services       Precautions / Restrictions Precautions Precautions: Fall Restrictions Weight Bearing Restrictions: Yes LLE Weight Bearing: Weight bearing as tolerated    Mobility  Bed Mobility Overal bed mobility: Needs Assistance;+ 2 for safety/equipment Bed Mobility: Supine to Sit     Supine to sit: Mod assist     General bed mobility comments: 2nd assist for lines and safety  Transfers Overall transfer level: Needs assistance Equipment used: Rolling walker (2 wheeled) Transfers: Sit to/from BJ'sStand;Stand Pivot Transfers Sit to Stand: Min assist;Mod assist;+2 physical assistance;+2 safety/equipment Stand pivot transfers: Min assist;Mod assist;+2 physical assistance;+2 safety/equipment       General transfer comment: pt requiring vc's for technique and safety  Ambulation/Gait Ambulation/Gait assistance: Min assist;Mod assist;+2 physical assistance;+2 safety/equipment Ambulation Distance (Feet): 3 Feet (bed to recliner  chair) Assistive device: Rolling walker (2 wheeled)       General Gait Details: antalgic; decreased stance time L LE; vc's required to increase UE support on RW and for safety and walker management   Stairs            Wheelchair Mobility    Modified Rankin (Stroke Patients Only)       Balance Overall balance assessment: Needs assistance Sitting-balance support: Bilateral upper extremity supported Sitting balance-Leahy Scale: Fair     Standing balance support: Bilateral upper extremity supported Standing balance-Leahy Scale: Poor                      Cognition Arousal/Alertness: Awake/alert Behavior During Therapy: Impulsive Overall Cognitive Status: Impaired/Different from baseline Area of Impairment: Attention;Safety/judgement;Awareness;Problem solving;Memory     Memory: Decreased recall of precautions   Safety/Judgement: Decreased awareness of deficits;Decreased awareness of safety   Problem Solving: Requires verbal cues      Exercises      General Comments  Nursing cleared pt for participation in physical therapy and getting pt up to bedside chair.  Pt agreeable to PT session and getting to chair.      Pertinent Vitals/Pain Pain Assessment: Faces Faces Pain Scale: Hurts whole lot (with mobility 8/10; at rest 2-4/10) Pain Location: L hip Pain Intervention(s): Limited activity within patient's tolerance;Monitored during session;Repositioned;Patient requesting pain meds-RN notified    Home Living                      Prior Function            PT Goals (  current goals can now be found in the care plan section) Acute Rehab PT Goals Patient Stated Goal: To get better PT Goal Formulation: With patient Time For Goal Achievement: 04/25/15 Potential to Achieve Goals: Good Progress towards PT goals: Progressing toward goals    Frequency  BID    PT Plan Current plan remains appropriate    Co-evaluation             End of  Session Equipment Utilized During Treatment: Gait belt;Oxygen (6 L/min via nasal cannula) Activity Tolerance: Patient limited by pain Patient left: in chair;with call bell/phone within reach;with chair alarm set;with nursing/sitter in room     Time: 1550-1615 PT Time Calculation (min) (ACUTE ONLY): 25 min  Charges:  $Therapeutic Activity: 23-37 mins                    G CodesHendricks George 02-May-2015, 4:43 PM Micheal George, PT [phone]

## 2015-04-12 NOTE — Progress Notes (Signed)
Throughout day pt has had increased episodes of HR 140-155/min. ST/SVT.   These episodes were earlier exacerbated by activity/agitation but now a few at rest.  Dr Mariah MillingGollan ordered po cardizem earlier and now we are adding amiodarone bolus and gtt.  PT here and pt was up in chair for 3 hrs WBAT. Urinary retention.  In and out cath x1. Dr Juliene PinaMody aware.

## 2015-04-12 NOTE — Progress Notes (Addendum)
Inpatient Diabetes Program Recommendations  AACE/ADA: New Consensus Statement on Inpatient Glycemic Control (2013)  Target Ranges:  Prepandial:   less than 140 mg/dL      Peak postprandial:   less than 180 mg/dL (1-2 hours)      Critically ill patients:  140 - 180 mg/dL     Results for Charlsie QuestSANDERS, Fitzpatrick H (MRN [id number]) as of 04/12/2015 09:48  Ref. Range 04/11/2015 07:21 04/11/2015 11:37 04/11/2015 16:44 04/11/2015 20:19 04/12/2015 00:00  Glucose-Capillary Latest Ref Range: 65-99 mg/dL 409330 (H) 811342 (H) 914291 (H) 402 (H) 390 (H)    Results for Charlsie QuestSANDERS, Jyles H (MRN [id number]) as of 04/12/2015 09:48  Ref. Range 04/12/2015 07:23  Glucose-Capillary Latest Ref Range: 65-99 mg/dL 086241 (H)     Home DM Meds: Levemir 20 units QHS  Current DM Orders: Levemir 20 units QHS (started last PM)            Novolog Sensitive SSI (0-9 units) tid ac + HS    **Patient currently receiving IV Solumedrol 40 mg Q8 hours.   **Glucose levels significantly elevated.  **Needs better glucose control post-op to help with healing.   MD- Please consider the following in-hospital insulin adjustments if patient continues to have elevated glucose levels:  1. Increase Levemir to 25 units QHS (~20% increase)  2. Increase Novolog SSI to Moderate scale (0-15 units) tid ac + HS   Will follow Ambrose FinlandJeannine Johnston Kade Rickels RN, MSN, CDE Diabetes Coordinator Inpatient Glycemic Control Team Team Pager: [phone] (8a-5p)

## 2015-04-13 ENCOUNTER — Inpatient Hospital Stay: Payer: Medicare Other

## 2015-04-13 DIAGNOSIS — N170 Acute kidney failure with tubular necrosis: Secondary | ICD-10-CM | POA: Insufficient documentation

## 2015-04-13 DIAGNOSIS — N189 Chronic kidney disease, unspecified: Secondary | ICD-10-CM

## 2015-04-13 DIAGNOSIS — N179 Acute kidney failure, unspecified: Secondary | ICD-10-CM | POA: Insufficient documentation

## 2015-04-13 LAB — CBC
HCT: 30.7 % — ABNORMAL LOW (ref 40.0–52.0)
Hemoglobin: 9.7 g/dL — ABNORMAL LOW (ref 13.0–18.0)
MCH: 29.4 pg (ref 26.0–34.0)
MCHC: 31.5 g/dL — ABNORMAL LOW (ref 32.0–36.0)
MCV: 93.4 fL (ref 80.0–100.0)
PLATELETS: 96 10*3/uL — AB (ref 150–440)
RBC: 3.29 MIL/uL — AB (ref 4.40–5.90)
RDW: 17 % — ABNORMAL HIGH (ref 11.5–14.5)
WBC: 13 10*3/uL — AB (ref 3.8–10.6)

## 2015-04-13 LAB — BASIC METABOLIC PANEL
ANION GAP: 6 (ref 5–15)
BUN: 71 mg/dL — ABNORMAL HIGH (ref 6–20)
CO2: 24 mmol/L (ref 22–32)
CREATININE: 2.76 mg/dL — AB (ref 0.61–1.24)
Calcium: 8.5 mg/dL — ABNORMAL LOW (ref 8.9–10.3)
Chloride: 110 mmol/L (ref 101–111)
GFR calc Af Amer: 22 mL/min — ABNORMAL LOW (ref >60)
GFR, EST NON AFRICAN AMERICAN: 19 mL/min — AB (ref >60)
Glucose, Bld: 182 mg/dL — ABNORMAL HIGH (ref 65–99)
Potassium: 4.8 mmol/L (ref 3.5–5.1)
SODIUM: 140 mmol/L (ref 135–145)

## 2015-04-13 LAB — GLUCOSE, CAPILLARY
GLUCOSE-CAPILLARY: 191 mg/dL — AB (ref 65–99)
Glucose-Capillary: 140 mg/dL — ABNORMAL HIGH (ref 65–99)
Glucose-Capillary: 327 mg/dL — ABNORMAL HIGH (ref 65–99)
Glucose-Capillary: 323 mg/dL — ABNORMAL HIGH (ref 65–99)
Glucose-Capillary: 326 mg/dL — ABNORMAL HIGH (ref 65–99)

## 2015-04-13 LAB — MAGNESIUM: Magnesium: 2.3 mg/dL (ref 1.7–2.4)

## 2015-04-13 LAB — TSH: TSH: 5.769 u[IU]/mL — ABNORMAL HIGH (ref 0.350–4.500)

## 2015-04-13 MED ORDER — IPRATROPIUM-ALBUTEROL 0.5-2.5 (3) MG/3ML IN SOLN: 3.0000 mL | RESPIRATORY_TRACT | Status: DC | PRN

## 2015-04-13 MED ORDER — PANTOPRAZOLE SODIUM 40 MG PO PACK
40.0000 mg | PACK | Freq: Every day | ORAL | Status: DC
  Filled 2015-04-13: qty 20

## 2015-04-13 MED ORDER — AMIODARONE HCL 200 MG PO TABS
400.0000 mg | ORAL_TABLET | Freq: Two times a day (BID) | ORAL | Status: DC
  Administered 2015-04-13 (×2): 400 mg via ORAL
  Filled 2015-04-13 (×2): qty 2

## 2015-04-13 MED ORDER — FLEET ENEMA 7-19 GM/118ML RE ENEM: 1.0000 | ENEMA | Freq: Every day | RECTAL | Status: DC | PRN

## 2015-04-13 MED ORDER — LACTULOSE 10 GM/15ML PO SOLN: 30.0000 g | Freq: Two times a day (BID) | ORAL | Status: DC | PRN

## 2015-04-13 MED ORDER — HEPARIN SODIUM (PORCINE) 5000 UNIT/ML IJ SOLN
5000.0000 [IU] | Freq: Three times a day (TID) | INTRAMUSCULAR | Status: DC
  Administered 2015-04-13 (×3): 5000 [IU] via SUBCUTANEOUS
  Filled 2015-04-13 (×3): qty 1

## 2015-04-13 MED ORDER — BISACODYL 10 MG RE SUPP
10.0000 mg | Freq: Every day | RECTAL | Status: DC | PRN
  Administered 2015-04-13: 10 mg via RECTAL
  Filled 2015-04-13: qty 1

## 2015-04-13 MED ORDER — SODIUM CHLORIDE 0.9 % IV BOLUS (SEPSIS): 250.0000 mL | Freq: Once | INTRAVENOUS | Status: DC

## 2015-04-13 NOTE — Care Management Note (Deleted)
Case Management Note  Patient Details  Name: Micheal George MRN: [id number] Date of Birth: 05/17/1925  Subjective/Objective: Transfer order in for 2A. Paged Dr. Joice LoftsPoggi to see if patient was medically stable for discharge to SNF today. Awaiting return call.                   Action/Plan:   Expected Discharge Date:  2015/05/20               Expected Discharge Plan:  Skilled Nursing Facility  In-House Referral:  Clinical Social Work  Discharge planning Services  CM Consult  Post Acute Care Choice:    Choice offered to:     DME Arranged:    DME Agency:     HH Arranged:    HH Agency:     Status of Service:  In process, will continue to follow  Medicare Important Message Given:  Yes-second notification given Date Medicare IM Given:    Medicare IM give by:    Date Additional Medicare IM Given:    Additional Medicare Important Message give by:     If discussed at Long Length of Stay Meetings, dates discussed:    Additional Comments:  Micheal MemosLisa M Lillie Bollig, RN 2015-05-07, 9:25 AM

## 2015-04-13 NOTE — Progress Notes (Signed)
Code blue called: Patient became bradycardiac - 30s on tele, then when nursing staff checked was unresponsive Received epi x2 Atropine x1 Bicarb x1 reperfusing rhythm NSR 90s Intubated ET #8  Transfer to ICU, repeat code - loss of rhythm,  Spoke with patients daughter - ginger about tonight's events.  She states that he would not want these extreme measures including CPR, with the knowledge that he will ultimately die if CPR is stopped She stated "he was ready to go" and she wants him to be comfortable CPR stopped Loss of pulse, loss of rhythm, patient time of death 10:45pm 04/01/2015 Daughter is coming to the hospital currently

## 2015-04-13 NOTE — Progress Notes (Signed)
Speech Language Pathology Treatment: Dysphagia  Patient Details Name: Micheal George MRN: [id number] DOB: 11/25/1924 Today's Date: 03/16/2015 Time: 1435-1450 SLP Time Calculation (min) (ACUTE ONLY): 15 min  Assessment / Plan / Recommendation Clinical Impression  Pt continues to present with severe oropharyngeal dysphagia characterized by coughing with presented with semi solid trials, nectar thick trials. St did not present thin trials due to deficits with nectar thick trials. Pt daughter present for session and was requesting clarification on  Diet and how to feed. St educated daughter pt was able to feed self and increased safety. St educated on diet recommendations and aspiration risk. Daughter verbally agreed/ verbalized understanding.    HPI     Pertinent Vitals Pain Assessment: No/denies pain  SLP Plan  Continue with current plan of care    Recommendations Diet recommendations: Dysphagia 1 (puree);Honey-thick liquid Liquids provided via: Cup Medication Administration: Crushed with puree Supervision: Patient able to self feed              Plan: Continue with current plan of care    GO     Meredith PelStacie Harris Sauber 04/10/2015, 4:09 PM

## 2015-04-13 NOTE — Progress Notes (Signed)
Daughter, Ginger, called and notified of patient periods of apnea and bradycardia. It was also explained that CPR was in progress.

## 2015-04-13 NOTE — Progress Notes (Signed)
Physical Therapy Treatment Patient Details Name: Micheal George MRN: [id number] DOB: April 24, 1925 Today's Date: 03/16/2015    History of Present Illness Pt is an 81 y.o. male s/p fall sustaining a 2 part intertrochanteric fracture.  Pt s/p ORIF L hip April 11, 2015.  Pt also with mild CHF, hypoxia, and heart murmur.  Pt now with ARF.    PT Comments    Pt demonstrates impaired activity tolerance and was SOB with activity this afternoon requiring frequent rest breaks.  Pt unable to take any steps today for ambulation and required max assist x2 to transfer bed to recliner to commode (for bowel movement) and back to recliner (hoyer pad placed under pt for nursing transfer back to bed; nursing notified).  Will continue to progress pt per pt tolerance.  Follow Up Recommendations  SNF     Equipment Recommendations  Rolling walker with 5" wheels    Recommendations for Other Services       Precautions / Restrictions Precautions Precautions: Fall Restrictions Weight Bearing Restrictions: Yes LLE Weight Bearing: Weight bearing as tolerated    Mobility  Bed Mobility Overal bed mobility: Needs Assistance;+2 for physical assistance Bed Mobility: Supine to Sit     Supine to sit: Mod assist;+2 for physical assistance     General bed mobility comments: assist for trunk and L LE; assist to scoot forward on edge of bed  Transfers Overall transfer level: Needs assistance Equipment used: Rolling walker (2 wheeled) Transfers: Sit to/from [employer] Sit to Stand: Max assist;+2 physical assistance (pt stood with RW; pt unable to stand upright (L and R knees buckling)) Stand pivot transfers: Max assist;+2 physical assistance (transfer (no AD) bed to recliner to commode back to recliner).  Pt required vc's for safe transfer technique each transfer and also rest breaks d/t fatigue.       General transfer comment: hoyer pad placed under pt for nursing transfer back to  bed  Ambulation/Gait             General Gait Details: pt unable to take any steps (B knees buckling)   Stairs            Wheelchair Mobility    Modified Rankin (Stroke Patients Only)       Balance Overall balance assessment: Needs assistance Sitting-balance support: Bilateral upper extremity supported Sitting balance-Leahy Scale: Fair     Standing balance support: Bilateral upper extremity supported Standing balance-Leahy Scale: Poor                      Cognition Arousal/Alertness: Awake/alert Behavior During Therapy: WFL for tasks assessed/performed Overall Cognitive Status: Impaired/Different from baseline Area of Impairment: Orientation;Memory Orientation Level: Disoriented to;Time (Pt reported it was April 06 2075.)   Memory: Decreased recall of precautions              Exercises      General Comments  Nursing cleared pt for participation in physical therapy.  Pt agreeable to PT session.  Pt now transferred from CCU to floor.      Pertinent Vitals/Pain Pain Assessment: 0-10 Faces Pain Scale: Hurts even more Pain Location: L hip Pain Descriptors / Indicators: Aching;Sore;Tender Pain Intervention(s): Limited activity within patient's tolerance;Monitored during session;Repositioned  HR 66-78 bpm during session.  O2 on 2 L/min via nasal cannula WFL during session (although SOB noted).    Home Living  Prior Function            PT Goals (current goals can now be found in the care plan section) Acute Rehab PT Goals Patient Stated Goal: To get better PT Goal Formulation: With patient Time For Goal Achievement: 04/25/15 Potential to Achieve Goals: Good Progress towards PT goals:  (pt unable to take any steps today for ambulation)    Frequency  BID    PT Plan Current plan remains appropriate    Co-evaluation             End of Session Equipment Utilized During Treatment: Gait  belt;Oxygen Activity Tolerance: Patient limited by fatigue;Patient limited by pain Patient left: in chair;with call bell/phone within reach;with chair alarm set     Time: [id number] PT Time Calculation (min) (ACUTE ONLY): 46 min  Charges:  $Therapeutic Activity: 38-52 mins                    G CodesHendricks George:      Micheal George  Micheal LimesEmily Samwise George, PT [phone]

## 2015-04-13 NOTE — Care Management Note (Signed)
Case Management Note  Patient Details  Name: Micheal George MRN: [id number] Date of Birth: 01/25/1925  Subjective/Objective:   Case dicussed with Dr. Mariah MillingGollan. Started on amiodarone gtt  Due to SVT with HR 150's. Significant arrhythmia, renal function poor and trending upward.  Plan is transfer to 2a pending. Appetite poor. No BM since surgery.              Action/Plan:   Expected Discharge Date:                 Expected Discharge Plan:  Skilled Nursing Facility  In-House Referral:  Clinical Social Work  Discharge planning Services  CM Consult  Post Acute Care Choice:    Choice offered to:     DME Arranged:    DME Agency:     HH Arranged:    HH Agency:     Status of Service:  In process, will continue to follow  Medicare Important Message Given:  Yes-second notification given Date Medicare IM Given:    Medicare IM give by:    Date Additional Medicare IM Given:    Additional Medicare Important Message give by:     If discussed at Long Length of Stay Meetings, dates discussed:    Additional Comments:  Marily MemosLisa M Ysidro Ramsay, RN 04/10/2015, 9:39 AM

## 2015-04-13 NOTE — Consult Note (Addendum)
Reason for Consult:ARF Referring Physician: Dr mody PMD: Dr Danne Harbor is an 81 y.o. male.  HPI: Patient presented from home after a fall when he got up to go to bathroom on Sunday night. Suffered left hip fracture. Underwent surgical repair on 6/27. Surgery complicated by hypotension perioperatively. Patient now has developed ARF. He has CKD and baseline Cr is 1.79/GFR 32 Today's Crt is 2.76 and worseining UOP is 535 cc. Foley has been removed Patient is able to eat. No nausea of vomiting reported.   Past Medical History  Diagnosis Date  . COPD (chronic obstructive pulmonary disease)   . Diabetes mellitus   . Neuropathy   . Thyroid disease     hypothyroidism  . Renal failure   . Unsteady gait   . HTN (hypertension)   . Hyperlipidemia   . Dysrhythmia   . Hypothyroidism   . Aortic stenosis   . LVH (left ventricular hypertrophy)     Past Surgical History  Procedure Laterality Date  . Hemorrhoid surgery  1975  . Intramedullary (im) nail intertrochanteric Left 03/21/2015    Procedure: INTRAMEDULLARY (IM) NAIL INTERTROCHANTRIC;  Surgeon: Corky Mull, MD;  Location: [facility] ORS;  Service: Orthopedics;  Laterality: Left;    Family History  Problem Relation Age of Onset  . Heart disease Father     Social History:  reports that he quit smoking about 51 years ago. His smoking use included Cigarettes and Pipe. He does not have any smokeless tobacco history on file. He reports that he does not drink alcohol or use illicit drugs.  Allergies: No Known Allergies  Medications: { Current facility-administered medications:  .  0.9 %  sodium chloride infusion, , Intravenous, Continuous, Minna Merritts, MD, Last Rate: 20 mL/hr at 04/11/15 1800 .  acetaminophen (TYLENOL) tablet 650 mg, 650 mg, Oral, Q6H PRN **OR** acetaminophen (TYLENOL) suppository 650 mg, 650 mg, Rectal, Q6H PRN, Bettey Costa, MD .  alum & mag hydroxide-simeth (MAALOX/MYLANTA) 200-200-20 MG/5ML suspension 30  mL, 30 mL, Oral, Q6H PRN, Bettey Costa, MD .  amiodarone (PACERONE) tablet 400 mg, 400 mg, Oral, BID, Ryan M Dunn, PA-C .  aspirin EC tablet 81 mg, 81 mg, Oral, Daily, Corky Mull, MD, 81 mg at 04/12/15 0905 .  bisacodyl (DULCOLAX) suppository 10 mg, 10 mg, Rectal, Daily PRN, Corky Mull, MD .  clopidogrel (PLAVIX) tablet 75 mg, 75 mg, Oral, Daily, Corky Mull, MD, 75 mg at 04/12/15 0906 .  diltiazem (CARDIZEM) tablet 30 mg, 30 mg, Oral, 4 times per day, Minna Merritts, MD, 30 mg at 03/24/2015 0555 .  diphenhydrAMINE (BENADRYL) 12.5 MG/5ML elixir 12.5-25 mg, 12.5-25 mg, Oral, Q4H PRN, Corky Mull, MD .  docusate sodium (COLACE) capsule 100 mg, 100 mg, Oral, BID, Corky Mull, MD, 100 mg at 04/12/15 0905 .  gabapentin (NEURONTIN) capsule 100 mg, 100 mg, Oral, QHS, Corky Mull, MD, 100 mg at 04/12/15 2227 .  haloperidol lactate (HALDOL) injection 1 mg, 1 mg, Intravenous, Q6H PRN, Bettey Costa, MD, 1 mg at 04/11/2015 0042 .  heparin injection 5,000 Units, 5,000 Units, Subcutaneous, 3 times per day, Bettey Costa, MD .  hydrALAZINE (APRESOLINE) injection 10 mg, 10 mg, Intravenous, Q6H PRN, Bettey Costa, MD .  hydrALAZINE (APRESOLINE) tablet 50 mg, 50 mg, Oral, BID, Bettey Costa, MD, 50 mg at 04/12/15 0905 .  HYDROmorphone (DILAUDID) injection 0.25-0.5 mg, 0.25-0.5 mg, Intravenous, Q2H PRN, Corky Mull, MD, 0.5 mg at 04/12/2015 0027 .  insulin aspart (novoLOG) injection 0-15 Units, 0-15 Units, Subcutaneous, TID WC, Bettey Costa, MD, 15 Units at 04/12/15 2222 .  insulin aspart (novoLOG) injection 0-5 Units, 0-5 Units, Subcutaneous, QHS, Bettey Costa, MD, 5 Units at 04/11/15 2121 .  insulin detemir (LEVEMIR) injection 25 Units, 25 Units, Subcutaneous, QHS, Bettey Costa, MD, 25 Units at 04/12/15 2220 .  ipratropium-albuterol (DUONEB) 0.5-2.5 (3) MG/3ML nebulizer solution 3 mL, 3 mL, Nebulization, Q6H, Sital Mody, MD, 3 mL at 03/18/2015 0749 .  levothyroxine (SYNTHROID, LEVOTHROID) tablet 50 mcg, 50 mcg, Oral, QAC  breakfast, Bettey Costa, MD, 50 mcg at 04/12/15 0748 .  LORazepam (ATIVAN) injection 1 mg, 1 mg, Intravenous, Q6H PRN, Hillary Bow, MD, 1 mg at 04/12/15 0402 .  magnesium hydroxide (MILK OF MAGNESIA) suspension 30 mL, 30 mL, Oral, Daily PRN, Corky Mull, MD .  methylPREDNISolone sodium succinate (SOLU-MEDROL) 40 mg/mL injection 40 mg, 40 mg, Intravenous, Daily, Flora Lipps, MD .  metoCLOPramide (REGLAN) tablet 5-10 mg, 5-10 mg, Oral, Q8H PRN **OR** metoCLOPramide (REGLAN) injection 5-10 mg, 5-10 mg, Intravenous, Q8H PRN, Corky Mull, MD .  mometasone-formoterol (DULERA) 100-5 MCG/ACT inhaler 2 puff, 2 puff, Inhalation, BID, Bettey Costa, MD, 2 puff at 04/12/15 2020 .  ondansetron (ZOFRAN) tablet 4 mg, 4 mg, Oral, Q6H PRN **OR** ondansetron (ZOFRAN) injection 4 mg, 4 mg, Intravenous, Q6H PRN, Bettey Costa, MD, 4 mg at 03/23/2015 1410 .  oxyCODONE (Oxy IR/ROXICODONE) immediate release tablet 5-10 mg, 5-10 mg, Oral, Q3H PRN, Corky Mull, MD, 5 mg at 04/12/15 2002 .  pantoprazole (PROTONIX) EC tablet 40 mg, 40 mg, Oral, Daily, Sital Mody, MD, 40 mg at 04/11/15 1020 .  senna-docusate (Senokot-S) tablet 1 tablet, 1 tablet, Oral, QHS PRN, Bettey Costa, MD, 1 tablet at 04/12/15 1654 .  simvastatin (ZOCOR) tablet 20 mg, 20 mg, Oral, Daily, Sital Mody, MD, 20 mg at 04/11/15 1021 .  sodium chloride 0.9 % bolus 250 mL, 250 mL, Intravenous, Once, Ryan M Dunn, PA-C .  sodium chloride 0.9 % injection 3 mL, 3 mL, Intravenous, Q12H, Sital Mody, MD, 3 mL at 04/12/15 2200 .  sodium phosphate (FLEET) 7-19 GM/118ML enema 1 enema, 1 enema, Rectal, Daily PRN, Corky Mull, MD  Results for orders placed or performed during the hospital encounter of 04/11/2015 (from the past 48 hour(s))  Glucose, capillary     Status: Abnormal   Collection Time: 04/11/15 11:37 AM  Result Value Ref Range   Glucose-Capillary 342 (H) 65 - 99 mg/dL  Potassium     Status: Abnormal   Collection Time: 04/11/15 12:53 PM  Result Value Ref Range    Potassium 5.4 (H) 3.5 - 5.1 mmol/L  Glucose, capillary     Status: Abnormal   Collection Time: 04/11/15  4:44 PM  Result Value Ref Range   Glucose-Capillary 291 (H) 65 - 99 mg/dL  Glucose, capillary     Status: Abnormal   Collection Time: 04/11/15  8:19 PM  Result Value Ref Range   Glucose-Capillary 402 (H) 65 - 99 mg/dL  Glucose, capillary     Status: Abnormal   Collection Time: 04/12/15 12:00 AM  Result Value Ref Range   Glucose-Capillary 390 (H) 65 - 99 mg/dL  Blood gas, arterial     Status: Abnormal   Collection Time: 04/12/15  4:45 AM  Result Value Ref Range   FIO2 0.60 %   Delivery systems HI FLOW NASAL CANNULA    pH, Arterial 7.36 7.350 - 7.450   pCO2 arterial 30 (  L) 32.0 - 48.0 mmHg   pO2, Arterial 100 83.0 - 108.0 mmHg   Bicarbonate 16.9 (L) 21.0 - 28.0 mEq/L   Acid-base deficit 7.3 (H) 0.0 - 2.0 mmol/L   O2 Saturation 97.5 %   Patient temperature 37.0    Collection site RIGHT RADIAL    Sample type ARTERIAL DRAW    Allens test (pass/fail) POSITIVE (A) PASS  Basic metabolic panel     Status: Abnormal   Collection Time: 04/12/15  6:33 AM  Result Value Ref Range   Sodium 140 135 - 145 mmol/L   Potassium 5.1 3.5 - 5.1 mmol/L   Chloride 110 101 - 111 mmol/L   CO2 20 (L) 22 - 32 mmol/L   Glucose, Bld 256 (H) 65 - 99 mg/dL   BUN 59 (H) 6 - 20 mg/dL   Creatinine, Ser 2.59 (H) 0.61 - 1.24 mg/dL   Calcium 8.4 (L) 8.9 - 10.3 mg/dL   GFR calc non Af Amer 21 (L) >60 mL/min   GFR calc Af Amer 24 (L) >60 mL/min    Comment: (NOTE) The eGFR has been calculated using the CKD EPI equation. This calculation has not been validated in all clinical situations. eGFR's persistently <60 mL/min signify possible Chronic Kidney Disease.    Anion gap 10 5 - 15  CBC     Status: Abnormal   Collection Time: 04/12/15  6:33 AM  Result Value Ref Range   WBC 12.6 (H) 3.8 - 10.6 K/uL   RBC 3.28 (L) 4.40 - 5.90 MIL/uL   Hemoglobin 9.7 (L) 13.0 - 18.0 g/dL   HCT 30.6 (L) 40.0 - 52.0 %   MCV  93.2 80.0 - 100.0 fL   MCH 29.6 26.0 - 34.0 pg   MCHC 31.7 (L) 32.0 - 36.0 g/dL   RDW 16.6 (H) 11.5 - 14.5 %   Platelets 84 (L) 150 - 440 K/uL  Glucose, capillary     Status: Abnormal   Collection Time: 04/12/15  7:23 AM  Result Value Ref Range   Glucose-Capillary 241 (H) 65 - 99 mg/dL  Glucose, capillary     Status: Abnormal   Collection Time: 04/12/15 11:00 AM  Result Value Ref Range   Glucose-Capillary 280 (H) 65 - 99 mg/dL  Glucose, capillary     Status: Abnormal   Collection Time: 04/12/15  4:50 PM  Result Value Ref Range   Glucose-Capillary 405 (H) 65 - 99 mg/dL  Glucose, capillary     Status: Abnormal   Collection Time: 04/12/15  9:51 PM  Result Value Ref Range   Glucose-Capillary 452 (H) 65 - 99 mg/dL  Basic metabolic panel     Status: Abnormal   Collection Time: 04/04/2015  5:57 AM  Result Value Ref Range   Sodium 140 135 - 145 mmol/L   Potassium 4.8 3.5 - 5.1 mmol/L   Chloride 110 101 - 111 mmol/L   CO2 24 22 - 32 mmol/L   Glucose, Bld 182 (H) 65 - 99 mg/dL   BUN 71 (H) 6 - 20 mg/dL   Creatinine, Ser 2.76 (H) 0.61 - 1.24 mg/dL   Calcium 8.5 (L) 8.9 - 10.3 mg/dL   GFR calc non Af Amer 19 (L) >60 mL/min   GFR calc Af Amer 22 (L) >60 mL/min    Comment: (NOTE) The eGFR has been calculated using the CKD EPI equation. This calculation has not been validated in all clinical situations. eGFR's persistently <60 mL/min signify possible Chronic Kidney Disease.  Anion gap 6 5 - 15  CBC     Status: Abnormal   Collection Time: 03/18/2015  5:57 AM  Result Value Ref Range   WBC 13.0 (H) 3.8 - 10.6 K/uL   RBC 3.29 (L) 4.40 - 5.90 MIL/uL   Hemoglobin 9.7 (L) 13.0 - 18.0 g/dL   HCT 30.7 (L) 40.0 - 52.0 %   MCV 93.4 80.0 - 100.0 fL   MCH 29.4 26.0 - 34.0 pg   MCHC 31.5 (L) 32.0 - 36.0 g/dL   RDW 17.0 (H) 11.5 - 14.5 %   Platelets 96 (L) 150 - 440 K/uL  Glucose, capillary     Status: Abnormal   Collection Time: 04/12/2015  7:37 AM  Result Value Ref Range   Glucose-Capillary  140 (H) 65 - 99 mg/dL    Dg Chest 1 View  03/28/2015   CLINICAL DATA:  Shortness of breath  EXAM: CHEST  1 VIEW  COMPARISON:  04/11/2015  FINDINGS: Mild cardiomegaly. Improving aeration of the lungs with decreasing vascular congestion and interstitial edema. Mild residual left basilar opacity could reflect asymmetric edema or atelectasis. No effusions. No acute bony abnormality.  IMPRESSION: Improving vascular congestion and interstitial edema pattern. Left base residual opacity could reflect atelectasis or residual edema.   Electronically Signed   By: Rolm Baptise M.D.   On: 03/16/2015 07:39    Review of Systems  Constitutional: Negative for fever, chills and malaise/fatigue.  HENT: Positive for hearing loss. Negative for nosebleeds and tinnitus.   Eyes: Negative for blurred vision, double vision and photophobia.  Respiratory: Negative for cough, hemoptysis, sputum production and shortness of breath.   Cardiovascular: Positive for leg swelling. Negative for chest pain, palpitations, orthopnea and claudication.  Gastrointestinal: Negative for heartburn, nausea, vomiting, abdominal pain, diarrhea and constipation.  Genitourinary: Negative for dysuria, urgency, frequency, hematuria and flank pain.  Musculoskeletal: Negative for myalgias, back pain and neck pain.  Skin: Negative for rash.  Neurological: Negative for dizziness, tingling, tremors, sensory change, speech change, loss of consciousness and headaches.  Endo/Heme/Allergies: Negative for environmental allergies. Does not bruise/bleed easily.  Psychiatric/Behavioral: Negative for depression.   Blood pressure 119/57, pulse 42, temperature 97.6 F (36.4 C), temperature source Axillary, resp. rate 19, height 5' 9"  (1.753 m), weight 72.576 kg (160 lb), SpO2 97 %. Physical Exam  Vitals reviewed. Constitutional:  NAD, laying in bed. Daughter at beside. Elderly, frail  HENT:  Head: Normocephalic and atraumatic.  Eyes: Pupils are equal,  round, and reactive to light. No scleral icterus.  Neck: Neck supple. No JVD present. Thyromegaly present.  Cardiovascular: Normal rate and regular rhythm.  Exam reveals no gallop and no friction rub.   Murmur heard. Respiratory: Effort normal and breath sounds normal. No respiratory distress. He has no wheezes. He has no rales.  GI: Soft. Bowel sounds are normal. He exhibits no distension. There is no tenderness. There is no rebound and no guarding.  Musculoskeletal: He exhibits edema.  Neurological: He is alert.  Skin: Skin is warm and dry. No rash noted. No erythema.  Psychiatric: He has a normal mood and affect.    Assessment/Plan: 81 y/o WM with COPD, severe Pulm HTN, mod to severe Aortic stenosis, Pulm regurg, Tricuspid regurg, neuropathy, CKD presented with left hip Fx post fall at home. Underwent surgery 6/27  1. ARF on CKD st 3 Baseline Cr 1.79/ GFR 32 ARF is likely secondary to ATN given patient was hypotensive in the perioperative period Electrolytes and Volume status are acceptable  No acute indication for Dialysis at present  Supportive care PO intake is adequate Volume status is acceptable. No need for supplemental iv fluids Will follow  2. Constipation - avoid Phos containing and Mag citrate in setting of renal failure - will use lactulose   Romonda Parker 03/24/2015, 9:54 AM

## 2015-04-13 NOTE — Progress Notes (Signed)
[facility] -  at [facility]   PATIENT NAME: Micheal George    MR#:  [id number]  DATE OF BIRTH:  12-28-1924  SUBJECTIVE:  Patient had an episode of SVT yesterday. He was started on amiodarone drip. Patient is less confused this morning. Patient denies chest pain, shortness of breath or abdominal pain.  REVIEW OF SYSTEMS:    Review of Systems  Constitutional: Negative for fever, chills and malaise/fatigue.  HENT: Negative for sore throat.   Eyes: Negative for blurred vision.  Respiratory: Negative for cough, hemoptysis, shortness of breath and wheezing.   Cardiovascular: Negative for chest pain, palpitations and leg swelling.  Gastrointestinal: Positive for constipation. Negative for nausea, vomiting, abdominal pain, diarrhea and blood in stool.  Genitourinary: Negative for dysuria.  Musculoskeletal: Negative for back pain.  Neurological: Negative for dizziness, tremors and headaches.  Endo/Heme/Allergies: Does not bruise/bleed easily.  Psychiatric/Behavioral: Negative for hallucinations.    Tolerating Diet: yes     DRUG ALLERGIES:  No Known Allergies  VITALS:  Blood pressure 119/79, pulse 42, temperature 97.6 F (36.4 C), temperature source Axillary, resp. rate 19, height  (1.753 m), weight 72.576 kg (160 lb), SpO2 97 %.  PHYSICAL EXAMINATION:   Physical Exam  Constitutional: He is well-developed, well-nourished, and in no distress. No distress.  HENT:  Head: Normocephalic.  Eyes: No scleral icterus.  Neck: Normal range of motion. Neck supple. No JVD present. No tracheal deviation present.  Cardiovascular: Normal rate and regular rhythm.  Exam reveals no gallop and no friction rub.   Murmur heard. Pulmonary/Chest: Effort normal. No respiratory distress. He has no wheezes. He has no rales. He exhibits no tenderness.  Abdominal: Soft. Bowel sounds are normal. He exhibits no distension and no mass. There is no tenderness. There is no  rebound and no guarding.  Musculoskeletal: Normal range of motion. He exhibits no edema.  Neurological: He is alert.  Skin: Skin is warm. No rash noted. No erythema.  Psychiatric:         LABORATORY PANEL:   CBC  Recent Labs Lab 03/30/2015 0557  WBC 13.0*  HGB 9.7*  HCT 30.7*  PLT 96*   ------------------------------------------------------------------------------------------------------------------  Chemistries   Recent Labs Lab 03/29/2015 0557  NA 140  K 4.8  CL 110  CO2 24  GLUCOSE 182*  BUN 71*  CREATININE 2.76*  CALCIUM 8.5*  MG 2.3   ------------------------------------------------------------------------------------------------------------------  Cardiac Enzymes  Recent Labs Lab 2015/04/20 0426  TROPONINI <0.03   ------------------------------------------------------------------------------------------------------------------  RADIOLOGY:    ASSESSMENT AND PLAN:  81 yo male with history of DM, HLD, HTN without documented CAD with history of PVCs/Bigeminy, COPD and renal insufficiency on chronic Plavix who is admitted after a fall and found to have a left hip fracture s/p intertrochanteric repair 6/26.  1. Acute diastolic heart failure: 2-D echocardiogram showed EF of 50% with mild diastolic dysfunction. Patient had lower extremity edema as well as chest x-ray showing pulmonary edema. Lasix is on hold due to ATN. Cardiology is following.   2. Acute on chronic kidney disease stage III: Likely due to ATN in the setting of hypotension and hypoxia postop. We are holding Lasix for today and repeat BMP in a.m. Avoid nephrotoxic agents. Nephrology has been consultated as well. Renal ultrasound has been ordered.  3. Aortic stenosis: His echo this time shows a thickened, calcified aortic valve with moderate to severe stenosis. Patient will need to have close follow-up with cardiology.   4. COPD: Continue to wean  IV steroids. Continue inhalers.   5. Acute  respiratory failure: After his procedure patient had increased O2 requirements. He is transferred to stepdown unit on high flow nasal cannula. He has been weaned off high flow nasal cannula and onto nasal cannula.    6. Diabetes: Patient will continue with sliding scale insulin and Levemir. His blood sugars have improved now that we have decreased his IV steroids.  7. Thrombocytopenia: Platelets have improved. We will continue heparin acute.  8. Hypothyroidism: Continue Synthroid  9. Nutrition: Continue a dysphagia 1 diet as per speech consultation.  10. Delirium: I suspect this is a combination of being in the intensive care unit as well as IV steroids. He does have some underlying cognitive issues as well leading to delirium. Continue to orient him. Awake in the a.m. and sleep at night. Haldol when necessary has been ordered.   11. Bigeminy/SVT: Patient has had frequent episodes of SVT. He was initially started on amiodarone infusion. This has been discontinued and transitioned to by mouth amiodarone as per cardiology consultation. He is at high risk of atrial fibrillation given his current comorbidities.  12. Hip fracture: Plan as per orthopedics. She will need physical therapy consultation and likely skilled nursing facility.  13. Sundowning: This is improved. Continue to monitor  Management plans discussed with the patient's daughter    CODE STATUS: Full  TOTAL TIME TAKING CARE OF THIS PATIENT: 28 minutes.   Greater than 50% counseling and coordination of care  POSSIBLE D/C 3-4 days , DEPENDING ON CLINICAL CONDITION. Patient is stable to be transferred to telemetry.  Kattia Selley M.D on 04/04/2015 at 11:02 AM  Between 7am to 6pm - Pager - [phone] After 6pm go to [email or web address] - password [email or web address] [facility]  [facility]  Office  [phone]  CC: Primary care physician; Corky DownsMASOUD,JAVED, MD

## 2015-04-13 NOTE — Progress Notes (Signed)
Pt was yelling out and then the Telemetry clerk called out for someone to check on the pt because his heart rate had gone down to the 30's, when someone went into the room the pt was apneic and had gone unresponsive. A code blue was called, compressions were started and started bagging him as well, ED MD ordered for meds to be pushed and to intubate the pt, after intubation the pt was then transferred to CCU. Pt did have a pulse when being transferred to CCU.

## 2015-04-13 NOTE — ED Provider Notes (Signed)
[facility] Department of Emergency Medicine   Code Blue CONSULT NOTE  Chief Complaint: Cardiac arrest/unresponsive   Level V Caveat: Unresponsive  History of present illness: I was contacted by the hospital for a CODE BLUE cardiac arrest upstairs and presented to the patient's bedside in room 260  ROS: Unable to obtain, Level V caveat  Scheduled Meds: . amiodarone  400 mg Oral BID  . aspirin EC  81 mg Oral Daily  . clopidogrel  75 mg Oral Daily  . diltiazem  30 mg Oral 4 times per day  . docusate sodium  100 mg Oral BID  . gabapentin  100 mg Oral QHS  . heparin subcutaneous  5,000 Units Subcutaneous 3 times per day  . hydrALAZINE  50 mg Oral BID  . insulin aspart  0-15 Units Subcutaneous TID WC  . insulin aspart  0-5 Units Subcutaneous QHS  . insulin detemir  25 Units Subcutaneous QHS  . levothyroxine  50 mcg Oral QAC breakfast  . methylPREDNISolone (SOLU-MEDROL) injection  40 mg Intravenous Daily  . mometasone-formoterol  2 puff Inhalation BID  . pantoprazole sodium  40 mg Per Tube Daily  . simvastatin  20 mg Oral Daily  . sodium chloride  3 mL Intravenous Q12H   Continuous Infusions:  PRN Meds:.acetaminophen **OR** acetaminophen, alum & mag hydroxide-simeth, bisacodyl, diphenhydrAMINE, haloperidol lactate, hydrALAZINE, HYDROmorphone (DILAUDID) injection, [START ON 03/24/2015] ipratropium-albuterol, lactulose, LORazepam, metoCLOPramide **OR** metoCLOPramide (REGLAN) injection, ondansetron **OR** ondansetron (ZOFRAN) IV, oxyCODONE, senna-docusate Past Medical History  Diagnosis Date  . COPD (chronic obstructive pulmonary disease)   . Diabetes mellitus   . Neuropathy   . Thyroid disease     hypothyroidism  . Renal failure   . Unsteady gait   . HTN (hypertension)   . Hyperlipidemia   . Dysrhythmia   . Hypothyroidism   . Aortic stenosis   . LVH (left ventricular hypertrophy)    Past Surgical History  Procedure Laterality Date  . Hemorrhoid  surgery  1975  . Intramedullary (im) nail intertrochanteric Left 05-05-15    Procedure: INTRAMEDULLARY (IM) NAIL INTERTROCHANTRIC;  Surgeon: Christena Flake, MD;  Location: [facility] ORS;  Service: Orthopedics;  Laterality: Left;   History   Social History  . Marital Status: Single    Spouse Name: N/A  . Number of Children: N/A  . Years of Education: N/A   Occupational History  . Not on file.   Social History Main Topics  . Smoking status: Former Smoker    Types: Cigarettes, Pipe    Quit date: 04/09/1964  . Smokeless tobacco: Not on file  . Alcohol Use: No  . Drug Use: No  . Sexual Activity: Not on file   Other Topics Concern  . Not on file   Social History Narrative   No Known Allergies  Last set of Vital Signs (not current) Filed Vitals:   04/04/2015 1957  BP: 106/64  Pulse: 64  Temp:   Resp: 20      Physical Exam  Gen: unresponsive with CPR being performed. Cardiovascular: pulseless  Resp: apneic. Breath sounds limited but equal bilaterally with bagging  Abd: nondistended  Neuro: GCS 3, unresponsive to pain  HEENT: No blood in posterior pharynx, gag reflex absent   Musculoskeletal: No deformity, noted postsurgical incision on left hip status post hip fracture repair  Skin: warm, no rash. Dressings on left hip surgical incision are intact.  Procedures  INTUBATION Performed by respiratory therapy Brett Canales), supervised by me, Darien Ramus Required items:  required special equipment available Patient identity confirmed: provided demographic data and hospital-assigned identification number Indications: Cardiac and respiratory arrest  Intubation method: Direct laryngoscopy with a #4 MacIntosh blade Preoxygenation: BVM Sedatives: None  Paralytic: None  Tube Size:8-0 cuffed Post-procedure assessment: chest rise and ETCO2 monitor, was positive  Breath sounds: equal and chest and absent over the epigastrium Tube secured by Respiratory Therapy   Cardiopulmonary  Resuscitation (CPR) Procedure Note  Directed/Performed by: Darien RamusKAMINSKI,Seleena Reimers W I personally directed ancillary staff and performed CPR in an effort to regain return of spontaneous circulation and to maintain cardiac, neuro and systemic perfusion.    Medical Decision making  Arrival in room 260 to find CPR being conducted on this 81 year old male who had been in the hospital for a left hip fracture and subsequent SVT. Received report from nurses: Patient had been doing well and had been up out of bed to transfer to the toilet. Upon returning to bed he became less responsive and then unresponsive. CPR was begun, CODE BLUE was called. Pads were applied. Good quality CPR was being performed. I had CPR held briefly to assess for a spontaneous rhythm. The patient appeared to have an erratic in slow organized beat on the monitor. CPR was continued and he received 1 amp of epinephrine. An attempt was made at intubating the patient. This was difficult due to the ongoing compressions. I asked respiratory to hold on further attempt. We continue to perform bag valve mask ventilation.  The patient was given atropine on my order. We also gave him an amp of bicarbonate and then another amp of epinephrine with it was time. The patient developed a spontaneous pulse at the next pulse check. At that time the patient was intubated with an 80 tube by rest for a therapy with my direct support. Dr. Clint GuyHower, of the internal medicine service, had joined us halfway through this code. We began to make arrangements to move the patient to the intensive care unit. We were told the daughter was on the telephone and reporting that she did not want ongoing efforts made. At this point he had been resuscitated and had a spontaneous pulse. As we transfer the patient to the ICU he began to develop bradycardia. Dr. Clint GuyHower went to speak with the daughter on the telephone. Once in the ICU room, the patient went further into bradycardia and had no  palpable pulse. I initiated CPR and performed compressions for approximately 3 minutes. The patient received another amp of epinephrine. He then again had return of spontaneous circulation.  The patient went from what appeared to be normal sinus rhythm on the monitor to atrial flutter with intermittent ventricular beats. These beats were palpable and also audible by Doppler. Without significant change in the QRS, he went into a faster rate which looked like a more 1-1 or 1-2 conduction area Dr. Clint GuyHower return to the room to report that the daughter was requesting no further interventions. We agreed that this patient would no longer receive compressions. At that time he started to bradycardia down again. I left the patient in the care of Dr. Clint GuyHower. I understand that his time of death was recorded as 2245.  Assessment and Plan  Cardiopulmonary arrest with a prolonged attempt at resuscitation. The patient did die. Cardiopulmonary resuscitation, unsuccessful    Darien Ramusavid W Monseratt Ledin, MD 04/07/2015 2315

## 2015-04-13 NOTE — Progress Notes (Addendum)
Patient: Micheal George / Admit Date: 2015-04-20 / Date of Encounter: 04/02/2015, 8:03 AM   Subjective: Less confused and not agitated this morning. Renal function continues to trend upwards. Off HFNC, on 2L via nasal cannula with O2 sats at 100%.   Review of Systems: Unable to perform ROS secondary to confusion.   Objective: Telemetry: NSR, 70's, occasional PVCs, frequent episodes of SVT into the 140's to 150's on 6/28 - improved s/p amiodarone infusion  Physical Exam: Blood pressure 124/62, pulse 42, temperature 97.6 F (36.4 C), temperature source Axillary, resp. rate 11, height  (1.753 m), weight 160 lb (72.576 kg), SpO2 100 %. Body mass index is 23.62 kg/(m^2). General: Well developed, well nourished, in no acute distress, confused. Head: Normocephalic, atraumatic, sclera non-icteric, no xanthomas, nares are without discharge. Neck: Negative for carotid bruits. JVP not elevated. Lungs: Diffuse bilateral crackles and rhonchi. Breathing is unlabored. Heart: RRR S1 S2. III/VI systolic murmur RUSB and apex, I/VI diastolic murmur LUSB. No rubs or gallops.  Abdomen: Soft, non-tender, non-distended with normoactive bowel sounds. No rebound/guarding. Extremities: No clubbing or cyanosis. No edema. Distal pedal pulses are 2+ and equal bilaterally. Neuro: Alert. Moves all extremities spontaneously. Psych:  Confused.   Intake/Output Summary (Last 24 hours) at 03/24/2015 0803 Last data filed at 04/12/15 2200  Gross per 24 hour  Intake    243 ml  Output    535 ml  Net   -292 ml    Inpatient Medications:  . aspirin EC  81 mg Oral Daily  . clopidogrel  75 mg Oral Daily  . diltiazem  30 mg Oral 4 times per day  . docusate sodium  100 mg Oral BID  . gabapentin  100 mg Oral QHS  . hydrALAZINE  50 mg Oral BID  . insulin aspart  0-15 Units Subcutaneous TID WC  . insulin aspart  0-5 Units Subcutaneous QHS  . insulin detemir  25 Units Subcutaneous QHS  . ipratropium-albuterol  3 mL  Nebulization Q6H  . levothyroxine  50 mcg Oral QAC breakfast  . methylPREDNISolone (SOLU-MEDROL) injection  40 mg Intravenous Daily  . mometasone-formoterol  2 puff Inhalation BID  . pantoprazole  40 mg Oral Daily  . simvastatin  20 mg Oral Daily  . sodium chloride  3 mL Intravenous Q12H   Infusions:  . sodium chloride 20 mL/hr at 04/11/15 1800  . amiodarone 30 mg/hr (04/01/2015 0700)    Labs:  Recent Labs  04/12/15 0633 03/24/2015 0557  NA 140 140  K 5.1 4.8  CL 110 110  CO2 20* 24  GLUCOSE 256* 182*  BUN 59* 71*  CREATININE 2.59* 2.76*  CALCIUM 8.4* 8.5*   No results for input(s): AST, ALT, ALKPHOS, BILITOT, PROT, ALBUMIN in the last 72 hours.  Recent Labs  04/12/15 0633 04/07/2015 0557  WBC 12.6* 13.0*  HGB 9.7* 9.7*  HCT 30.6* 30.7*  MCV 93.2 93.4  PLT 84* 96*   No results for input(s): CKTOTAL, CKMB, TROPONINI in the last 72 hours. Invalid input(s): POCBNP No results for input(s): HGBA1C in the last 72 hours.   Weights: Filed Weights   Apr 20, 2015 0414  Weight: 160 lb (72.576 kg)     Radiology/Studies:  Dg Chest 1 View  03/27/2015   CLINICAL DATA:  Shortness of breath  EXAM: CHEST  1 VIEW  COMPARISON:  04/11/2015  FINDINGS: Mild cardiomegaly. Improving aeration of the lungs with decreasing vascular congestion and interstitial edema. Mild residual left basilar opacity could  reflect asymmetric edema or atelectasis. No effusions. No acute bony abnormality.  IMPRESSION: Improving vascular congestion and interstitial edema pattern. Left base residual opacity could reflect atelectasis or residual edema.   Electronically Signed   By: Charlett Nose M.D.   On: 04/03/2015 07:39   Dg Chest 1 View  04/11/2015   CLINICAL DATA:  COPD and CHF, hypoxia postop hip fracture  EXAM: CHEST  1 VIEW  COMPARISON:  Portable chest x-ray of April 10, 2015  FINDINGS: The lungs are adequately inflated. The interstitial markings are slightly more conspicuous bilaterally today. The cardiac  silhouette is enlarged. The central pulmonary vascularity is slightly more congested. The mediastinum is normal in width. The bony thorax exhibits no acute abnormality.  IMPRESSION: Interval development of low-grade pulmonary interstitial edema likely secondary to CHF.   Electronically Signed   By: David  [place] M.D.   On: 04/11/2015 07:32   Dg Chest 1 View  03/28/2015   CLINICAL DATA:  Postop hip fracture today now with hypoxia  EXAM: CHEST  1 VIEW  COMPARISON:  04/09/2015  FINDINGS: Mild cardiac enlargement stable. Central pulmonary arteries prominent but stable. Vascular pattern normal. No consolidation or effusion.  IMPRESSION: No acute findings   Electronically Signed   By: Esperanza Heir M.D.   On: 04/12/2015 15:22   Dg Chest 1 View  03/22/2015   CLINICAL DATA:  Status post fall forward in hallway. Concern for chest injury. Initial encounter.  EXAM: CHEST  1 VIEW  COMPARISON:  Chest radiograph performed 12/23/2014  FINDINGS: Vascular congestion is noted, with mildly increased interstitial markings, possibly reflecting minimal interstitial edema. No pleural effusion or pneumothorax is seen.  The cardiomediastinal silhouette is borderline enlarged. No acute osseous abnormalities are identified.  IMPRESSION: Vascular congestion and borderline cardiomegaly, with mildly increased interstitial markings again noted. This may reflect minimal interstitial edema. No displaced rib fracture seen.   Electronically Signed   By: Roanna Raider M.D.   On: 03/29/2015 05:09   US Venous Img Lower Unilateral Left  03/30/2015   CLINICAL DATA:  Lower extremity swelling 3 weeks.  EXAM: Left LOWER EXTREMITY VENOUS DOPPLER ULTRASOUND  TECHNIQUE: Gray-scale sonography with graded compression, as well as color Doppler and duplex ultrasound were performed to evaluate the lower extremity deep venous systems from the level of the common femoral vein and including the common femoral, femoral, profunda femoral, popliteal and calf  veins including the posterior tibial, peroneal and gastrocnemius veins when visible. The superficial great saphenous vein was also interrogated. Spectral Doppler was utilized to evaluate flow at rest and with distal augmentation maneuvers in the common femoral, femoral and popliteal veins.  COMPARISON:  06/02/2014  FINDINGS: Contralateral Common Femoral Vein: Respiratory phasicity is normal and symmetric with the symptomatic side. No evidence of thrombus. Normal compressibility.  Common Femoral Vein: No evidence of thrombus. Normal compressibility, respiratory phasicity and response to augmentation.  Saphenofemoral Junction: No evidence of thrombus. Normal compressibility and flow on color Doppler imaging.  Profunda Femoral Vein: No evidence of thrombus. Normal compressibility and flow on color Doppler imaging.  Femoral Vein: No evidence of thrombus. Normal compressibility, respiratory phasicity and response to augmentation.  Popliteal Vein: No evidence of thrombus. Normal compressibility, respiratory phasicity and response to augmentation. Minimal linear focal echogenic region along the wall which may represent calcification or sequelae of old thrombus.  Calf Veins: No evidence of thrombus. Normal compressibility and flow on color Doppler imaging.  Superficial Great Saphenous Vein: No evidence of thrombus. Normal compressibility and  flow on color Doppler imaging.  Venous Reflux:  None.  Other Findings:  None.  IMPRESSION: No evidence of deep venous thrombosis.   Electronically Signed   By: Elberta Fortisaniel  Boyle M.D.   On: 04/01/2015 07:48   Dg Hip Operative Unilat With Pelvis Left  03/29/2015   CLINICAL DATA:  Left hip fracture/reduction.  EXAM: OPERATIVE left HIP (WITH PELVIS IF PERFORMED) 3 VIEWS  TECHNIQUE: Fluoroscopic spot image(s) were submitted for interpretation post-operatively.  FLUOROSCOPY TIME:  Radiation Exposure Index (as provided by the fluoroscopic device):  If the device does not provide the exposure  index:  Fluoroscopy Time:  0 minutes 53 seconds  Number of Acquired Images:  3  COMPARISON:  04/02/2015  FINDINGS: Examination demonstrates placement of an intra medullary nail with associated compression screw bridging the femoral neck into the femoral head as hardware appears intact and normally located. There is anatomic alignment about patient's trochanteric fracture. Recommend correlation with findings at the time of the procedure.  IMPRESSION: Fixation of patient's left intertrochanteric fracture with hardware intact and anatomic alignment about the fracture site.   Electronically Signed   By: Elberta Fortisaniel  Boyle M.D.   On: 04/01/2015 14:38   Dg Hip Unilat With Pelvis 2-3 Views Left  03/31/2015   CLINICAL DATA:  Status post fall forward in hallway. Left groin pain and difficulty moving left leg. Initial encounter.  EXAM: LEFT HIP (WITH PELVIS) 2-3 VIEWS  COMPARISON:  None.  FINDINGS: There is a displaced mildly comminuted intertrochanteric fracture involving the proximal left femur, with extension into the proximal femoral diaphysis. There is mild lateral displacement of the distal femur. The right hip joint is grossly unremarkable. The left femoral head remains seated at the acetabulum.  The sacroiliac joints are grossly unremarkable. Scattered vascular calcifications are seen. The visualized bowel gas pattern is unremarkable.  IMPRESSION: Displaced mildly comminuted intertrochanteric fracture involving the proximal left femur, with extension into the proximal femoral diaphysis. Mild lateral displacement of the distal femur.   Electronically Signed   By: Roanna RaiderJeffery  Chang M.D.   On: 03/17/2015 05:08     Assessment and Plan  81 yo male with history of DM, HLD, HTN without documented CAD with history of PVCs/Bigeminy, COPD and renal insufficiency on chronic Plavix who is admitted after a fall and found to have a left hip fracture s/p intertrochanteric repair 6/26.    1. Acute diastolic CHF: Try to minimize IV  fluids given SOB requirement of increased oxygen. CXR showed interval development of low-grade pulmonary interstitial edema. Slight bump in SCr makes diuresis a delicate balance. IV fluids have been stopped. Advance Lasix as tolerated for diuresis pending renal function.   2. Acute on CKD stage III: Renal function continues to trend up with SCr at 2.7 today. Continue to hold Lasix this morning. Possibly 2/2 ATN in the setting of hypotension/anemia and hypoxia. Could attempt splash of IV hydration secondary to improved breathing with 250 mL NS bolus. Consider renal consult.     2. Pre-operative risk assessment: He has no known ischemic heart disease. He is treated for diastolic CHF with Lasix daily, mostly manifesting as lower extremity edema. He has had no change in his breathing recently and has had no chest pain to suggest angina. He is at least moderately mobile at baseline, using a cane to walk. He does take ASA and Plavix daily and will be at higher risk of post-operative bleeding, it is unclear why he is on these two medications at this time as  there is no documented CAD. He is at least at moderate risk for any surgical procedure given his advanced age but he and his family understand that he will not do well if he does not have the surgical repair of his hip fracture and they are willing to proceed, while understanding the associated risk.   3. SVT: Frequent episodes of SVT on 6/28 with heart rates into the 140' to 150's. Improved on amiodarone infusion. Transition to po amiodarone 400 mg bid x 1 week, followed by 200 mg bid x 1 week, followed by 200 mg daily as he is at high risk of a-fib given his current comorbidities    4. PVCs: He has a long history of PVCs with bigeminy. May need to use prn Lopressor in the peri-operative period if he has increase in frequency of PVCs.   5. Hip fracture: per ortho  6. Aortic stenosis: Echo on 03/2012 does not appear to have progressed much from mild to  moderate stenosis in 2013. Gradients appear to indicate mild to moderate stenosis. Would continue to monitor at this time. Mild to moderate by echo 2013. More recent echo in Dr. Renie Ora office. He has a thickened, calcified aortic valve with moderate to severe stenosis. Mean gradient . There is moderate AI.  7. Sundowning: Improved. PRN Ativan led to increase in agitation. Dilaudid seemed to help more overnight.   Elinor Dodge, PA-C Pager: [phone] 2015-04-23, 8:03 AM   Attending Note Patient seen and examined, agree with detailed note above,  Patient presentation and plan discussed on rounds.   Improved condition today, alert, communicative, appropriate, Heart rate and BP stable. Amiodarone infusion started last night by myself after talking with nurses for SVT,  Rates up to 140s, significant ectopy, high risk of developing atrial fibrillation.  Stable overnight. Worsening renal function (nephrology consulted). Suspect ATN as creatinine was climbing while on IVF initially (held for SOB/respiratory distress). Will defer to renal, could consider low rate IVF at 40 to 50 cc/hr one liter  Signed: Dossie Arbour  M.D., Ph.D.

## 2015-04-13 NOTE — Progress Notes (Signed)
Physical Therapy Treatment Patient Details Name: Micheal George MRN: [id number] DOB: 1925-02-14 Today's Date: 2015/05/07    History of Present Illness Pt is an 81 y.o. male s/p fall sustaining a 2 part intertrochanteric fracture.  Pt s/p ORIF L hip 03/22/2015.  Pt also with mild CHF, hypoxia, and heart murmur.      PT Comments    Pt falling asleep during LE bed ex's requiring intermittent vc's to stay awake.  Pt demonstrating less confusion today and now on amiodarone drip.  Per notes, pt also developed ARF.  OOB mobility deferred d/t pt fatigued post bed ex's and plan to transfer pt to floor soon.  Will continue to progress pt per pt tolerance.  Follow Up Recommendations  SNF     Equipment Recommendations  Rolling walker with 5" wheels    Recommendations for Other Services       Precautions / Restrictions Precautions Precautions: Fall Restrictions Weight Bearing Restrictions: Yes LLE Weight Bearing: Weight bearing as tolerated    Mobility  Bed Mobility               General bed mobility comments: Deferred d/t fatigue post bed ex's and plan to transfer pt to floor soon  Transfers                    Ambulation/Gait                 Stairs            Wheelchair Mobility    Modified Rankin (Stroke Patients Only)       Balance                                    Cognition Arousal/Alertness: Lethargic Behavior During Therapy: WFL for tasks assessed/performed Overall Cognitive Status: Impaired/Different from baseline                      Exercises   Performed semi-supine B LE therapeutic exercise x 15 reps:  Ankle pumps (AROM B LE's); quad sets x3 second holds (AROM B LE's); glute squeezes x3 second holds (AROM B); hip aDduction isometrics (pillow between pt's knees) x3 second holds; SAQ's (AAROM R; AAROM L); heelslides (AAROM R; AAROM L), hip abd/adduction (AAROM R; AAROM L), and SLR (AAROM R).  Pt required vc's and  tactile cues for correct technique with exercises and vc's to stay awake during ex's.     General Comments  Nursing cleared pt for participation in physical therapy.  Pt agreeable to PT session and pt's daughter present during session.      Pertinent Vitals/Pain Pain Assessment: 0-10 Pain Score: 8  Pain Location: L hip Pain Descriptors / Indicators: Tender;Sore Pain Intervention(s): Limited activity within patient's tolerance;Monitored during session;Premedicated before session;Repositioned  During session, pt's HR 66-69 bpm, O2 >94% on 2 L/min via nasal cannula, and BP 119/79 pre and 120/49 post session.    Home Living                      Prior Function            PT Goals (current goals can now be found in the care plan section) Acute Rehab PT Goals Patient Stated Goal: To get better PT Goal Formulation: With patient Time For Goal Achievement: 04/25/15 Potential to Achieve Goals: Good Progress towards PT goals: Progressing toward goals  Frequency  BID    PT Plan Current plan remains appropriate    Co-evaluation             End of Session Equipment Utilized During Treatment: Oxygen Activity Tolerance: Patient limited by fatigue;Patient limited by lethargy;Patient limited by pain Patient left: in bed;with call bell/phone within reach;with bed alarm set;with family/visitor present (B heels elevated via pillow)     Time: [id number] PT Time Calculation (min) (ACUTE ONLY): 16 min  Charges:  $Therapeutic Exercise: 8-22 mins                    G CodesHendricks Limes:      Anetra Czerwinski 2015/07/18, 11:19 AM  Hendricks LimesEmily Bob Daversa, PT [phone]

## 2015-04-13 NOTE — Progress Notes (Signed)
Patient alert with no distress noted. o2 sats upper 90's on 2L nasal cannula. Denies pain at this time. Dressing to left hip and leg is intact with dried drainage only to hip dressing. +1 bilateral pedal pulses. VSS. Afebrile. Daughters visiting this morning. Patient moved to 2A by bed. 2A teley monitor placed on patient and verified with Liborio NixonJanice, tely clerk prior to moving patient. Patient moved to new room by bed with o2 and tely monitor on and no distress noted. Moved by Georgiann MccoyGlen and Jaime, orderly's. Report called to Shanda BumpsJessica, RN prior to moving patient.  Mattingly Fountaine B

## 2015-04-13 NOTE — Significant Event (Signed)
Code blue called pt. Intubated by r.t. With #8 et tube using #814miller blade placed at 25cm at lip, equal breath sounds and positive end tidal co2 change et tube secured and pt. Transferred to ccu. Pt. Coded until called by dr. Clint GuyHower, all at about 2220 hours

## 2015-04-13 NOTE — Progress Notes (Signed)
  Subjective: 3 Days Post-Op Procedure(s) (LRB): INTRAMEDULLARY (IM) NAIL INTERTROCHANTRIC (Left) Patient is unable to address pain due to confusion.  Patient is confused.  Can tell you he is in the hospital but unable to give further details. Plan is to go Skilled nursing facility after hospital stay. Negative for chest pain and shortness of breath Fever: no Gastrointestinal:Negative for nausea and vomiting.  Dysphagia diet.  Objective: Vital signs in last 24 hours: Temp:  [98 F (36.7 C)] 98 F (36.7 C) (06/28 1700) Resp:  [11-33] 11 (06/29 0700) BP: (64-144)/(41-108) 124/62 mmHg (06/29 0700) SpO2:  [76 %-100 %] 100 % (06/29 0700) FiO2 (%):  [60 %] 60 % (06/28 0842)  Intake/Output from previous day:  Intake/Output Summary (Last 24 hours) at 10-03-15 0739 Last data filed at 04/12/15 2200  Gross per 24 hour  Intake    303 ml  Output    535 ml  Net   -232 ml    Intake/Output this shift:    Labs:  Recent Labs  04/11/15 0528 04/12/15 0633 10-03-15 0557  HGB 11.1* 9.7* 9.7*    Recent Labs  04/12/15 0633 10-03-15 0557  WBC 12.6* 13.0*  RBC 3.28* 3.29*  HCT 30.6* 30.7*  PLT 84* 96*    Recent Labs  04/12/15 0633 10-03-15 0557  NA 140 140  K 5.1 4.8  CL 110 110  CO2 20* 24  BUN 59* 71*  CREATININE 2.59* 2.76*  GLUCOSE 256* 182*  CALCIUM 8.4* 8.5*   No results for input(s): LABPT, INR in the last 72 hours.   EXAM General - Patient is Disorganized, Confused and Hallucinating Extremity - Neurologically intact Sensation intact distally Intact pulses distally Dorsiflexion/Plantar flexion intact Incision: scant drainage Dressing/Incision - blood tinged drainage.  No new drainage since POD1 Motor Function - intact, moving foot and toes well on exam.  Past Medical History  Diagnosis Date  . COPD (chronic obstructive pulmonary disease)   . Diabetes mellitus   . Neuropathy   . Thyroid disease     hypothyroidism  . Renal failure   . Unsteady gait    . HTN (hypertension)   . Hyperlipidemia   . Dysrhythmia   . Hypothyroidism   . Aortic stenosis   . LVH (left ventricular hypertrophy)     Assessment/Plan: 3 Days Post-Op Procedure(s) (LRB): INTRAMEDULLARY (IM) NAIL INTERTROCHANTRIC (Left) Active Problems:   Hip fracture   COLD (chronic obstructive lung disease)   Fall at home   Chronic diastolic CHF (congestive heart failure)   Hip fracture, left   Hypoxia  Estimated body mass index is 23.62 kg/(m^2) as calculated from the following:   Height as of this encounter: 5\' 9"  (1.753 m).   Weight as of this encounter: 72.576 kg (160 lb). Up with therapy  Dysphagia diet currently. PT recommending SNF following discharge. K+ down to 4.8 Currently on Amiodarone drip for runs of tachycardia. O2 lowered down to 2L today.  Abdomen appears distended with tympany.  Informed nursing staff to keep eye on this.  Currently on dysphagia diet.  He has not had a BM yet.   DVT Prophylaxis - Foot Pumps and TED hose.  Pt is on Plavix as well. Weight-Bearing as tolerated to left leg  J. Horris LatinoLance McGhee, PA-C [facility] 26-Oct-2014, 7:39 AM

## 2015-04-14 MED FILL — Medication: Qty: 1 | Status: AC

## 2015-04-15 LAB — GLUCOSE, CAPILLARY
GLUCOSE-CAPILLARY: 143 mg/dL — AB (ref 65–99)
GLUCOSE-CAPILLARY: 138 mg/dL — AB (ref 65–99)

## 2015-04-15 NOTE — Progress Notes (Signed)
   01/19/15 1045  Clinical Encounter Type  Visited With Patient;Health care provider  Visit Type Code  Referral From Nurse  Consult/Referral To Chaplain  Spiritual Encounters  Spiritual Needs Emotional  Stress Factors  Patient Stress Factors Health changes  Chaplain provided spiritual support and a compassionate presence during a code blue. Chaplain Berlin Viereck A. Andrey Mccaskill Ext. [phone]

## 2015-04-15 NOTE — Progress Notes (Signed)
   10-29-14 0025  Clinical Encounter Type  Visited With Patient;Family;Health care provider  Visit Type Death  Referral From Nurse  Consult/Referral To Chaplain  Spiritual Encounters  Spiritual Needs Emotional;Grief support  Stress Factors  Family Stress Factors Loss  Chaplain was paged to unit to meet with daughter after patient died. Provided a compassionate presence and assisted her with patient's belongings upon departure. Chaplain Samhita Kretsch A. Jon Lall Ext. [phone]

## 2015-04-15 NOTE — Discharge Summary (Signed)
This 81 year old male with history of diabetes, hypertension and hyperlipidemia as well as CAD and history of PVCs/bigeminy who presented to the emergency room after fall found to have a left hip fracture. For further details please refer to H&P.  1. Cardiac arrest: CODE BLUE was called on 03/28/2015. Patient became bradycardic and apneic. CODE BLUE and CPR was conducted. M.D. on call spoke with patient's daughter after prolonged attempt to resuscitate the patient. Daughter was requesting no further interventions after prolonged period of time. Patient was pronounced at 2245 on 03/17/2015.  2. Acute diastolic heart failure: Due to acute on chronic kidney disease it was very difficult to diurese the patient without doing more damage to the kidneys. Cardiology was following.  3. Acute on chronic kidney disease stage III: This was due to ATN in the setting of hypotension and hypoxia postoperatively. Nephrology was following.  4. Moderate to severe stenosis: His echocardiogram did show thickened calcified aortic valve with moderate to severe stenosis.  5. COPD: Patient was on IV steroids and inhalers.  6. acute respiratory failure after procedure patient had increased O2 requirement. He was transferred to stepdown unit on high flow nasal cannula. He had been weaned off the high flow onto nasal cannula and stooling well from a respiratory standpoint of view.  7. Bigeminy/SVT: e had frequent episodes of SVT and therefore was initially started on amiodarone drip. This was transitioned to by mouth amiodarone.  8. Hip fracture: Orthopedics was also following the patient. He did undergo ORIF.  TIME 35 minutes

## 2015-04-15 NOTE — Progress Notes (Signed)
Pt was on 2A and was brought to ICU with a code blue in progress.  Pt arrived on unit unresponsive, cpr in progress.  Dr. Clint GuyHower was present and daughter, Micheal, was called and pt was made DNR. Supervisor [name] was present, along with the chaplain. Daughter wanted all compressions stopped.  Pt passed away at 22:45.  Dr. Clint GuyHower confirmed time of death.  [facility] was called, I spoke with [name], and because of age, pt was not eligible for tissue or eye donation.  Reference number [id number] was given. Daughter, Micheal George, arrived with pt's teeth and spoke with [name] and myself.  Micheal George's grandson called back with request for exact time of death.

## 2015-04-15 DEATH — deceased

## 2017-01-05 IMAGING — CR DG HIP (WITH PELVIS) OPERATIVE*L*
1 series · 3 of 3 positions shown · non-contrast
Comparison: 04/10/2015

CLINICAL DATA: Left hip fracture/reduction.

EXAM:
OPERATIVE left HIP (WITH PELVIS IF PERFORMED) 3 VIEWS
TECHNIQUE: Fluoroscopic spot image(s) were submitted for interpretation
post-operatively.
FLUOROSCOPY TIME:  Radiation Exposure Index (as provided by the
fluoroscopic device):
If the device does not provide the exposure index:
Fluoroscopy Time:  0 minutes 53 seconds
Number of Acquired Images:  3

[Series 6001: (person_name) · 3 of 3 slices shown]
[im 1/3]
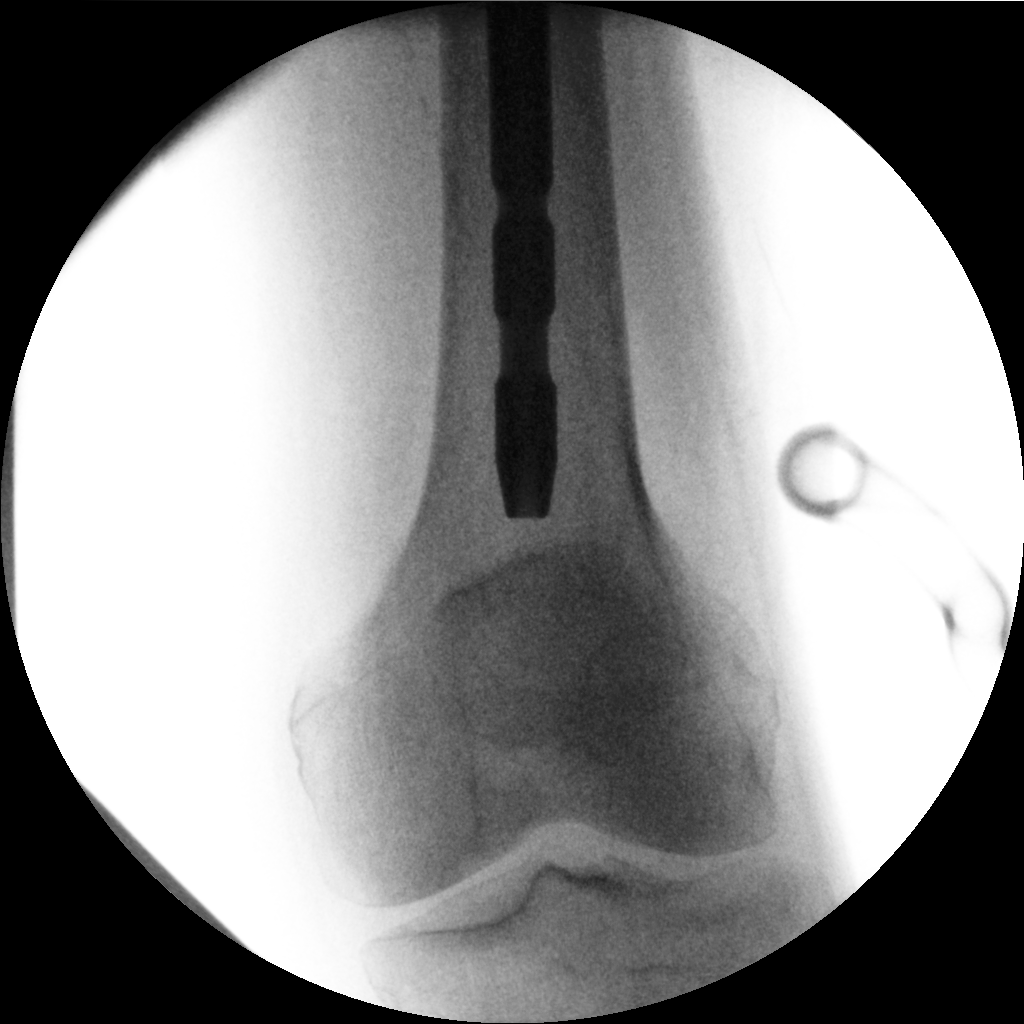
[im 2/3]
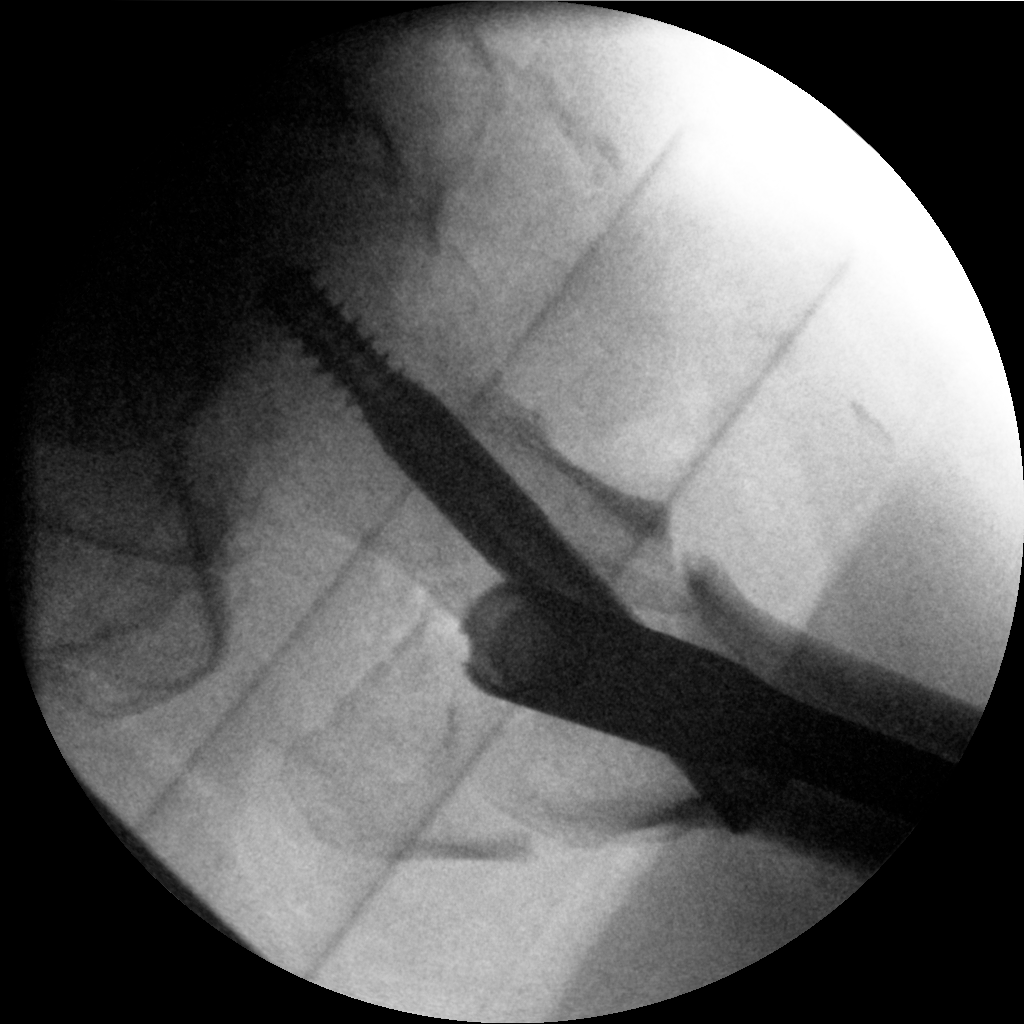
[im 3/3]
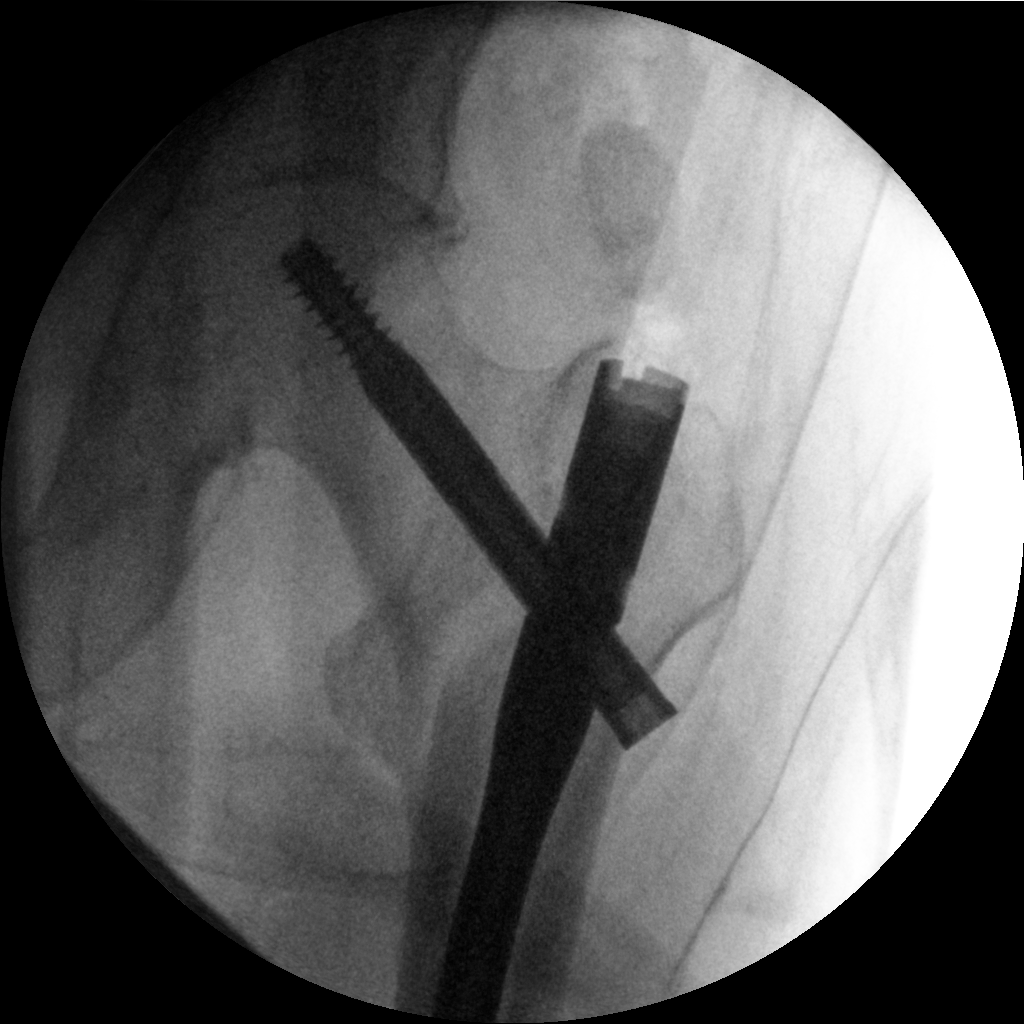

[3 of 3 positions shown; findings below may reference images not displayed]

FINDINGS: Examination demonstrates placement of an intra medullary nail with
associated compression screw bridging the femoral neck into the
femoral head as hardware appears intact and normally located. There
is anatomic alignment about patient's trochanteric fracture.
Recommend correlation with findings at the time of the procedure.
IMPRESSION: Fixation of patient's left intertrochanteric fracture with hardware
intact and anatomic alignment about the fracture site.
